# Patient Record
Sex: Female | Born: 1975 | Race: White | Hispanic: No | Marital: Married | Smoking: Former smoker
Health system: Southern US, Community
[De-identification: ages and names within clinical notes are randomized; demographics above are authoritative.]

## PROBLEM LIST (undated history)

## (undated) DIAGNOSIS — N61 Mastitis without abscess: Secondary | ICD-10-CM

## (undated) DIAGNOSIS — G47419 Narcolepsy without cataplexy: Secondary | ICD-10-CM

## (undated) DIAGNOSIS — N200 Calculus of kidney: Secondary | ICD-10-CM

## (undated) DIAGNOSIS — K8020 Calculus of gallbladder without cholecystitis without obstruction: Secondary | ICD-10-CM

## (undated) DIAGNOSIS — F329 Major depressive disorder, single episode, unspecified: Secondary | ICD-10-CM

## (undated) DIAGNOSIS — G4710 Hypersomnia, unspecified: Secondary | ICD-10-CM

## (undated) DIAGNOSIS — G47411 Narcolepsy with cataplexy: Principal | ICD-10-CM

## (undated) DIAGNOSIS — F32A Depression, unspecified: Secondary | ICD-10-CM

## (undated) DIAGNOSIS — F419 Anxiety disorder, unspecified: Secondary | ICD-10-CM

## (undated) HISTORY — DX: Calculus of gallbladder without cholecystitis without obstruction: K80.20

## (undated) HISTORY — PX: LITHOTRIPSY: SUR834

## (undated) HISTORY — DX: Calculus of kidney: N20.0

## (undated) HISTORY — DX: Narcolepsy with cataplexy: G47.411

## (undated) HISTORY — PX: OTHER SURGICAL HISTORY: SHX169

## (undated) HISTORY — DX: Major depressive disorder, single episode, unspecified: F32.9

## (undated) HISTORY — DX: Anxiety disorder, unspecified: F41.9

## (undated) HISTORY — DX: Narcolepsy without cataplexy: G47.419

## (undated) HISTORY — DX: Mastitis without abscess: N61.0

## (undated) HISTORY — DX: Hypersomnia, unspecified: G47.10

## (undated) HISTORY — DX: Depression, unspecified: F32.A

---

## 1997-05-30 ENCOUNTER — Emergency Department (HOSPITAL_COMMUNITY): Admission: EM | Admit: 1997-05-30 | Discharge: 1997-05-30 | Payer: Self-pay | Admitting: Emergency Medicine

## 1997-08-25 ENCOUNTER — Other Ambulatory Visit: Admission: RE | Admit: 1997-08-25 | Discharge: 1997-08-25 | Payer: Self-pay | Admitting: Family Medicine

## 1997-10-14 ENCOUNTER — Ambulatory Visit (HOSPITAL_COMMUNITY): Admission: RE | Admit: 1997-10-14 | Discharge: 1997-10-14 | Payer: Self-pay | Admitting: Obstetrics and Gynecology

## 1998-01-26 ENCOUNTER — Encounter: Payer: Self-pay | Admitting: Emergency Medicine

## 1998-01-26 ENCOUNTER — Emergency Department (HOSPITAL_COMMUNITY): Admission: EM | Admit: 1998-01-26 | Discharge: 1998-01-26 | Payer: Self-pay | Admitting: Emergency Medicine

## 1999-01-18 ENCOUNTER — Other Ambulatory Visit: Admission: RE | Admit: 1999-01-18 | Discharge: 1999-01-18 | Payer: Self-pay | Admitting: Obstetrics and Gynecology

## 2000-06-05 ENCOUNTER — Encounter: Payer: Self-pay | Admitting: Family Medicine

## 2000-06-05 ENCOUNTER — Encounter: Admission: RE | Admit: 2000-06-05 | Discharge: 2000-06-05 | Payer: Self-pay | Admitting: Family Medicine

## 2001-03-13 ENCOUNTER — Other Ambulatory Visit: Admission: RE | Admit: 2001-03-13 | Discharge: 2001-03-13 | Payer: Self-pay | Admitting: Obstetrics and Gynecology

## 2001-09-22 ENCOUNTER — Inpatient Hospital Stay (HOSPITAL_COMMUNITY): Admission: AD | Admit: 2001-09-22 | Discharge: 2001-09-22 | Payer: Self-pay | Admitting: Obstetrics and Gynecology

## 2001-09-23 ENCOUNTER — Inpatient Hospital Stay (HOSPITAL_COMMUNITY): Admission: AD | Admit: 2001-09-23 | Discharge: 2001-09-26 | Payer: Self-pay | Admitting: Obstetrics and Gynecology

## 2001-10-23 ENCOUNTER — Encounter: Admission: RE | Admit: 2001-10-23 | Discharge: 2001-11-22 | Payer: Self-pay | Admitting: Obstetrics and Gynecology

## 2001-10-29 ENCOUNTER — Other Ambulatory Visit: Admission: RE | Admit: 2001-10-29 | Discharge: 2001-10-29 | Payer: Self-pay | Admitting: Obstetrics and Gynecology

## 2001-11-23 ENCOUNTER — Encounter: Admission: RE | Admit: 2001-11-23 | Discharge: 2001-12-23 | Payer: Self-pay | Admitting: Obstetrics and Gynecology

## 2002-01-23 ENCOUNTER — Encounter: Admission: RE | Admit: 2002-01-23 | Discharge: 2002-02-22 | Payer: Self-pay | Admitting: Obstetrics and Gynecology

## 2002-02-23 ENCOUNTER — Encounter: Admission: RE | Admit: 2002-02-23 | Discharge: 2002-03-25 | Payer: Self-pay | Admitting: Obstetrics and Gynecology

## 2002-04-24 ENCOUNTER — Encounter: Admission: RE | Admit: 2002-04-24 | Discharge: 2002-05-24 | Payer: Self-pay | Admitting: Obstetrics and Gynecology

## 2002-11-25 ENCOUNTER — Other Ambulatory Visit: Admission: RE | Admit: 2002-11-25 | Discharge: 2002-11-25 | Payer: Self-pay | Admitting: Obstetrics and Gynecology

## 2005-06-27 ENCOUNTER — Ambulatory Visit (HOSPITAL_COMMUNITY): Admission: RE | Admit: 2005-06-27 | Discharge: 2005-06-27 | Payer: Self-pay | Admitting: Urology

## 2006-06-27 ENCOUNTER — Inpatient Hospital Stay (HOSPITAL_COMMUNITY): Admission: AD | Admit: 2006-06-27 | Discharge: 2006-06-29 | Payer: Self-pay | Admitting: Obstetrics and Gynecology

## 2006-06-30 ENCOUNTER — Encounter: Admission: RE | Admit: 2006-06-30 | Discharge: 2006-07-27 | Payer: Self-pay | Admitting: Obstetrics and Gynecology

## 2010-05-22 NOTE — H&P (Signed)
Joan Foley, TAT           ACCOUNT NO.:  [id number]   MEDICAL RECORD NO.:  [id number]          PATIENT TYPE:  INP   LOCATION:  9173                          FACILITY:  [facility]   PHYSICIAN:  Lenoard Aden, M.D.DATE OF BIRTH:  1975-04-20   DATE OF ADMISSION:  06/27/2006  DATE OF DISCHARGE:                              HISTORY & PHYSICAL   CHIEF COMPLAINT:  Moderate to severe polyhydramnios. She is a 35-year-  old white female, G2, P1, at 69 weeks' gestation with amniotic fluid  index of approximately 30 for induction. She is a nonsmoker, nondrinker.  She denies domestic or physical violence. She has a previous history of  spontaneous vaginal delivery, history of kidney stones, history of  laparoscopy for endometriosis, history of nonpremedicated heart murmur  which had previously resolved.   MEDICATIONS:  Included midodrine, prenatal vitamins and Zoloft as noted.   FAMILY HISTORY:  Of heart disease, colon and lung cancer, diabetes,  multiple sclerosis, rheumatoid arthritis, migraine headaches, epilepsy.   She had a history of a 7-pound 8-ounce spontaneous vaginal delivery in  2003.   On physical exam, she is a well-developed, well-nourished, white female  in no acute distress.  HEENT:  Normal.  LUNGS:  Clear.  HEART:  Regular rhythm.  ABDOMEN:  Soft, gravid, nontender, estimated fetal weight 7-1/2 pounds.  CERVIX:  3 cm, 80%, vertex and -1.  EXTREMITIES:  Revealed no cords.  NEUROLOGICAL EXAM:  Nonfocal.   IMPRESSION:  1. A 38-week intrauterine pregnancy.  2. Moderate to severe progressive polyhydramnios.   PLAN:  Proceed with induction.      Lenoard Aden, M.D.  Electronically Signed     RJT/MEDQ  D:  06/27/2006  T:  06/27/2006  Job:  [id number]   cc:   Lenoard Aden, M.D.  Fax: [phone]

## 2010-10-24 LAB — RAPID HIV SCREEN (WH-MAU): Rapid HIV Screen: NONREACTIVE

## 2010-10-24 LAB — CBC
HCT: 31.7 — ABNORMAL LOW
HCT: 34.5 — ABNORMAL LOW
Hemoglobin: 11.8 — ABNORMAL LOW
MCHC: 34.1
MCV: 91.1
Platelets: 141 — ABNORMAL LOW
Platelets: 146 — ABNORMAL LOW
RBC: 3.78 — ABNORMAL LOW
RDW: 13.4
RDW: 13.5
WBC: 10.6 — ABNORMAL HIGH
WBC: 13.5 — ABNORMAL HIGH

## 2010-10-24 LAB — RPR: RPR Ser Ql: NONREACTIVE

## 2012-08-20 ENCOUNTER — Encounter: Payer: Self-pay | Admitting: Neurology

## 2012-08-20 ENCOUNTER — Ambulatory Visit: Payer: 59 | Admitting: Neurology

## 2012-08-21 ENCOUNTER — Other Ambulatory Visit: Payer: Self-pay | Admitting: Neurology

## 2012-08-21 DIAGNOSIS — G47419 Narcolepsy without cataplexy: Secondary | ICD-10-CM

## 2012-08-21 MED ORDER — SODIUM OXYBATE 500 MG/ML PO SOLN: 4500.0000 mg | ORAL | Status: DC

## 2012-12-28 ENCOUNTER — Encounter: Payer: Self-pay | Admitting: Neurology

## 2012-12-28 ENCOUNTER — Ambulatory Visit (INDEPENDENT_AMBULATORY_CARE_PROVIDER_SITE_OTHER): Payer: 59 | Admitting: Neurology

## 2012-12-28 ENCOUNTER — Encounter (INDEPENDENT_AMBULATORY_CARE_PROVIDER_SITE_OTHER): Payer: Self-pay

## 2012-12-28 VITALS — BP 111/64 | HR 71 | Ht 64.0 in | Wt 128.0 lb

## 2012-12-28 DIAGNOSIS — G47411 Narcolepsy with cataplexy: Secondary | ICD-10-CM

## 2012-12-28 DIAGNOSIS — Z79899 Other long term (current) drug therapy: Secondary | ICD-10-CM | POA: Insufficient documentation

## 2012-12-28 DIAGNOSIS — G47419 Narcolepsy without cataplexy: Secondary | ICD-10-CM

## 2012-12-28 HISTORY — DX: Narcolepsy with cataplexy: G47.411

## 2012-12-28 MED ORDER — CLONAZEPAM 1 MG PO TABS: 1.0000 mg | ORAL_TABLET | Freq: Every day | ORAL | Status: DC

## 2012-12-28 MED ORDER — SODIUM OXYBATE 500 MG/ML PO SOLN: 4500.0000 mg | ORAL | Status: DC

## 2012-12-28 NOTE — Patient Instructions (Signed)
Narcolepsy Narcolepsy is a disabling neurological disorder of sleep regulation. It affects the control of sleep. It also affects the control of wakefulness. It is an interruption of the dreaming state of sleep. This state is known as REM or rapid eye movement sleep.  SYMPTOMS  The development, number, and severity of symptoms vary widely among people with the disorder. Symptoms generally begin between the ages of 15 and 30. The four classic symptoms of the disorder are:   Excessive daytime sleepiness.  Cataplexy. This is sudden, brief episodes of muscle weakness or paralysis. It is caused by strong emotions. Common strong emotions are laughter, anger, surprise, or anticipation.  Sleep paralysis. This is paralysis upon falling asleep or waking up.  Hallucinations. These are vivid dream-like images that occur at when you first fall asleep. Other symptoms include:   Unrelenting excessive sleepiness. This is usually the first and most obvious symptom.  Sleep attacks. Patients have strong sleep attacks throughout the day. These attacks can last for 30 seconds to more than 30 minutes. These happen no matter how much or how well the person slept the night before. These attacks end up making the person sleep at work and social events. The person can fall asleep while eating, talking, and driving. They also fall asleep at other out of place times.  Disturbed nighttime sleep.  Tossing and turning in bed.  Leg jerks.  Nightmares.  Waking up often. DIAGNOSIS  It's possible that genetics play a role in this disorder. Narcolepsy is not a rare disorder. It is often misdiagnosed. It is often diagnosed years after symptoms first appear. Early diagnosis and treatment are important. This help the physical and mental well-being of the patient. TREATMENT  There is no cure for narcolepsy. The symptoms can be controlled with behavioral and medical therapy. The excessive daytime sleepiness may be treated with  stimulant drugs. It may also be treated with the drug modafinil (Provigil). Cataplexy and other REM-sleep symptoms may be treated with antidepressant medications. Medications will reduce the symptoms. Medications will not ease symptoms entirely. Many available medications have side effects. Basic lifestyle changes may also reduce the symptoms. These changes include having regular sleep schedules and scheduled daytime naps. Other lifestyle changes include avoiding "over-stimulating" situations. Document Released: 12/14/2001 Document Revised: 03/18/2011 Document Reviewed: 12/24/2004 ExitCare Patient Information 2014 [employer].  

## 2012-12-28 NOTE — Progress Notes (Signed)
[facility]  Provider:  Melvyn Novas, M D  Referring Provider: / Primary Care Physician:  Jobe Igo    HPI:  Joan Foley is a 37 y.o. 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 Nolen Mu,  Joan Foley comes narcolepsy was diagnosed after a positive MSL T. on 06-27-09 following normal polysomnography. MSLT showed a MSL of 6. 1 minutes .   At the time she had endorsed  the Epworth sleepiness score at 18 points previously even 20 points at Dr. Loralie Champagne office. She had been temporarily on the residual but did not feel that it was enough to help her of his fatigue and sleepiness.  She has been treated with Zoloft and clonazepam for anxiety and depression in the past.  The patient originally presented with a body mass index of 29.5 at the time of her sleep evaluation.  Meanwhile on Xyrem she has lost weight.  Her last laboratory studies from 02-14-12 are all entirely normal.  She had normal sodium, potassium, chloride, carbon dioxide levels. Normal glucose and kidney function. Not anemic nor any elevated white blood cells. [name] states that she just changed employment and that she went from part-time to full-time but is actually physically and cognitively well able now to handle the challenges. She seems to feel more comfortable paternal job environment.  Today 's Epworth sleepiness score was and there is endorsed at 10 point and her fatigue severity score at 28 points. 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  Her office has a window now, unlike the old work place.   She recently had an unplanned stay over at a friends house and couldn't take her XYREM,  She had fragmented, very light sleep and felt poorly the following day.      Review of Systems: Out of a complete 14 system review, the patient complains of only the following symptoms, and all  other reviewed systems are negative. FSS 28 , Epworth 10,   History   Social History  . Marital Status: Married    Spouse Name: N/A    Number of Children: 2  . Years of Education: N/A   Occupational History  . Not on file.   Social History Main Topics  . Smoking status: Former Games developer  . Smokeless tobacco: Not on file     Comment: quit smoking in 2005  . Alcohol Use: Yes     Comment: socially  . Drug Use: Not on file  . Sexual Activity: Not on file   Other Topics Concern  . Not on file   Social History Narrative  . No narrative on file    Family History  Problem Relation Age of Onset  . Anxiety disorder Mother   . Depression Mother   . Narcolepsy Father   . Depression Father   . Anxiety disorder Father     Past Medical History  Diagnosis Date  . Excessive sleepiness   . 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 Anxiety   . Depression   . Kidney stone   . Mastitis     after breast feeding  . Gallstones   . 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 Lithotripsy      with renal stones  . Heart disease      valve  Current Outpatient Prescriptions  Medication Sig Dispense Refill  . clonazePAM (KLONOPIN) 1 MG tablet Take 1 mg by mouth daily.      Marland Kitchen lamoTRIgine (LAMICTAL) 100 MG tablet Take 100 mg by mouth daily.      . Sertraline HCl (ZOLOFT PO) Take 150 mg by mouth daily.      . Sodium Oxybate (XYREM) 500 MG/ML SOLN Take 9 mL (4,500 mg total) by mouth as directed. 4.5 gram twice nightly  270 mL  5   No current facility-administered medications for this visit.    Allergies as of 12/28/2012 - Review Complete 12/28/2012  Allergen Reaction Noted  . Seasonal ic [cholestatin]  08/20/2012    Vitals: There were no vitals taken for this visit. Last Weight:  Wt Readings from Last 1 Encounters:  08/20/12 118 lb (53.524 kg)   Last Height:   Ht Readings from Last 1 Encounters:  08/20/12 5' 3.5"  (1.613 m)    Physical exam:  General: The patient is awake, alert and appears not in acute distress. The patient is well groomed. Head: Normocephalic, atraumatic. Neck is supple. Mallampati 2 , neck circumference: 14 . Cardiovascular:  Regular rate and rhythm , without  murmurs or carotid bruit, and without distended neck veins. Respiratory: Lungs are clear to auscultation. Skin:  Without evidence of edema, or rash Trunk: BMI is normal,  posture.  Neurologic exam : The patient is awake and alert, oriented to place and time.  Memory subjective  described as intact. There is a normal attention span & concentration ability. Speech is fluent without  dysarthria, dysphonia or aphasia.  Mood and affect are appropriate. She stated she had a bout of anxiety before her recent job change, not any longer.   Cranial nerves: Pupils are equal and briskly reactive to light. Funduscopic exam without evidence of pallor or edema. Extraocular movements in vertical and horizontal planes intact and without nystagmus. Visual fields by finger perimetry are intact. Hearing to finger rub intact.   Facial sensation intact to fine touch. Facial motor strength is symmetric and tongue and uvula move midline.  Motor exam: Normal tone and  muscle bulk and symmetric strength in all extremities.  Sensory:  Fine touch, pinprick and vibration were tested in all extremities.  Proprioception is normal.  Coordination: Rapid alternating movements in the fingers/hands is tested and normal. Finger-to-nose maneuver tested and normal without evidence of ataxia, dysmetria or tremor.  Gait and station: Patient walks without assistive device .  Deep tendon reflexes: in the  upper and lower extremities are symmetric and intact. Babinski maneuver  down going.   Assessment:  After physical and neurologic examination, review of laboratory studies, imaging, neurophysiology testing and pre-existing records, assessment is :  1)  XYREM,  One nocturia at the time before the second dose.  2)  hypersomnia controlled, no sleep attack.   Plan:  Treatment plan and additional workup: XYREM _ continue at 9 grams divided into 2 doses at night.

## 2013-01-06 ENCOUNTER — Ambulatory Visit: Payer: 59 | Admitting: Neurology

## 2013-06-29 ENCOUNTER — Encounter (INDEPENDENT_AMBULATORY_CARE_PROVIDER_SITE_OTHER): Payer: Self-pay

## 2013-06-29 ENCOUNTER — Encounter: Payer: Self-pay | Admitting: Adult Health

## 2013-06-29 ENCOUNTER — Ambulatory Visit (INDEPENDENT_AMBULATORY_CARE_PROVIDER_SITE_OTHER): Payer: 59 | Admitting: Adult Health

## 2013-06-29 VITALS — BP 109/68 | HR 72 | Wt 123.5 lb

## 2013-06-29 DIAGNOSIS — Z79899 Other long term (current) drug therapy: Secondary | ICD-10-CM

## 2013-06-29 DIAGNOSIS — G47419 Narcolepsy without cataplexy: Secondary | ICD-10-CM

## 2013-06-29 MED ORDER — SODIUM OXYBATE 500 MG/ML PO SOLN: 4500.0000 mg | ORAL | Status: DC

## 2013-06-29 NOTE — Progress Notes (Signed)
PATIENT: Joan GeorgeMichelle M Foley DOB: 03/18/1975  REASON FOR VISIT: follow up HISTORY FROM: patient  HISTORY OF PRESENT ILLNESS:  Joan Foley is a 38 year old female with a history of narcolepsy without  cataplexy. She returns today for followup. The patient currently takes Xyrem and is tolerating it well. Her previous Epworth sleepiness score was 10 and fatigue severity score was 28. Today her at Epworth score is 9 and fatigue severity score is 36. The patient states she sleeps very well at night. States that her daytime sleepiness is controlled.  She is currently working a full-time job and is doing well. Patient reports that she has some family issues that is causing stress and has affected her sleep. She has recently reduced her dose of Klonopin and her anxiety has increased but she has noticed less sleepiness on the reduced dose.  No new medical issues since last seen.   REVIEW OF SYSTEMS: Full 14 system review of systems performed and notable only for:  Constitutional: N/A  Eyes: N/A Ear/Nose/Throat: N/A  Skin: N/A  Cardiovascular: N/A  Respiratory: N/A  Gastrointestinal: N/A  Genitourinary: N/A Hematology/Lymphatic: N/A  Endocrine: N/A Musculoskeletal:N/A  Allergy/Immunology: N/A  Neurological: N/A Psychiatric: N/A Sleep: N/A   ALLERGIES: Allergies  Allergen Reactions  . Seasonal Ic [Cholestatin]     HOME MEDICATIONS: Outpatient Prescriptions Prior to Visit  Medication Sig Dispense Refill  . clonazePAM (KLONOPIN) 1 MG tablet Take 1 tablet (1 mg total) by mouth daily. Take 1/2 tab po daily.  30 tablet  1  . lamoTRIgine (LAMICTAL) 100 MG tablet Take 100 mg by mouth daily.      . Sertraline HCl (ZOLOFT PO) Take 150 mg by mouth daily.      . Sodium Oxybate (XYREM) 500 MG/ML SOLN Take 9 mLs (4,500 mg total) by mouth as directed. 4.5 gram twice nightly  270 mL  5   No facility-administered medications prior to visit.    PAST MEDICAL HISTORY: Past Medical History    Diagnosis Date  . Excessive sleepiness   . 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 Anxiety   . Depression   . Kidney stone   . Mastitis     after breast feeding  . Gallstones   . 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 Lithotripsy      with renal stones  . Heart disease      valve    FAMILY HISTORY: Family History  Problem Relation Age of Onset  . Anxiety disorder Mother   . Depression Mother   . Narcolepsy Father   . Depression Father   . Anxiety disorder Father     SOCIAL HISTORY: History   Social History  . Marital Status: Married    Spouse Name: scott    Number of Children: 2  . 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 Smoking status: Former Games developermoker  . Smokeless tobacco: Not on file     Comment: quit smoking in 2005  . Alcohol Use: Yes     Comment: socially  . Drug Use: Not on file  . Sexual Activity: Not on file   Other Topics Concern  . Not on file   Social History Narrative  . No narrative on file      PHYSICAL EXAM  Filed Vitals:   06/29/13  1052  BP: 109/68  Pulse: 72  Weight: 123 lb 8 oz (56.019 kg)   Body mass index is 21.19 kg/(m^2).  Generalized: Well developed, in no acute distress   Neurological examination  Mentation: Alert oriented to time, place, history taking. Follows all commands speech and language fluent Cranial nerve II-XII: Pupils were equal round reactive to light.  Motor: The motor testing reveals 5 over 5 strength of all 4 extremities. Good symmetric motor tone is noted throughout.  Sensory: Sensory testing is intact to soft touch on all 4 extremities. No evidence of extinction is noted.  Coordination: Cerebellar testing reveals good finger-nose-finger and heel-to-shin bilaterally.  Gait and station: Gait is normal. Tandem gait is normal. Romberg is negative. No drift  is seen.  Reflexes: Deep tendon reflexes are symmetric and normal bilaterally.      DIAGNOSTIC DATA (LABS, IMAGING, TESTING) - I reviewed patient records, labs, notes, testing and imaging myself where available.  Lab Results  Component Value Date   WBC 13.5* 06/28/2006   HGB 11.0* 06/28/2006   HCT 31.7* 06/28/2006   MCV 90.1 06/28/2006   PLT 146* 06/28/2006     ASSESSMENT AND PLAN 38 y.o. 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 here with:  Patient has remained stable. Continues to take Xyrem with good benefit. I will refill today. Epworth Score is a 9.  Will check CMP today. Patient has reduced her Klonopin to 0.5mg , her anxiety has increased but she is learning other ways to control her anxiety. Follow-up in 6 months or sooner if needed.    Joan PennyMegan Millikan, MSN, NP-C 06/29/2013, 10:59 AM [facility] [address], [address] [place], [place] [address] [phone]  Note: This document was prepared with digital dictation and possible smart phrase technology. Any transcriptional errors that result from this process are unintentional.

## 2013-06-29 NOTE — Patient Instructions (Signed)
Narcolepsy Narcolepsy is a disabling neurological disorder of sleep regulation. It affects the control of sleep. It also affects the control of wakefulness. It is an interruption of the dreaming state of sleep. This state is known as REM or rapid eye movement sleep.  SYMPTOMS  The development, number, and severity of symptoms vary widely among people with the disorder. Symptoms generally begin between the ages of 15 and 30. The four classic symptoms of the disorder are:   Excessive daytime sleepiness.  Cataplexy. This is sudden, brief episodes of muscle weakness or paralysis. It is caused by strong emotions. Common strong emotions are laughter, anger, surprise, or anticipation.  Sleep paralysis. This is paralysis upon falling asleep or waking up.  Hallucinations. These are vivid dream-like images that occur at when you first fall asleep. Other symptoms include:   Unrelenting excessive sleepiness. This is usually the first and most obvious symptom.  Sleep attacks. Patients have strong sleep attacks throughout the day. These attacks can last for 30 seconds to more than 30 minutes. These happen no matter how much or how well the person slept the night before. These attacks end up making the person sleep at work and social events. The person can fall asleep while eating, talking, and driving. They also fall asleep at other out of place times.  Disturbed nighttime sleep.  Tossing and turning in bed.  Leg jerks.  Nightmares.  Waking up often. DIAGNOSIS  It's possible that genetics play a role in this disorder. Narcolepsy is not a rare disorder. It is often misdiagnosed. It is often diagnosed years after symptoms first appear. Early diagnosis and treatment are important. This help the physical and mental well-being of the patient. TREATMENT  There is no cure for narcolepsy. The symptoms can be controlled with behavioral and medical therapy. The excessive daytime sleepiness may be treated with  stimulant drugs. It may also be treated with the drug modafinil (Provigil). Cataplexy and other REM-sleep symptoms may be treated with antidepressant medications. Medications will reduce the symptoms. Medications will not ease symptoms entirely. Many available medications have side effects. Basic lifestyle changes may also reduce the symptoms. These changes include having regular sleep schedules and scheduled daytime naps. Other lifestyle changes include avoiding "over-stimulating" situations. Document Released: 12/14/2001 Document Revised: 03/18/2011 Document Reviewed: 12/16/2012 Exit[employer] Patient Information 2015 [employer]. This information is not intended to replace advice given to you by your health care Maddy Graham. Make sure you discuss any questions you have with your health care Gee Habig.  

## 2013-06-29 NOTE — Progress Notes (Signed)
I agree with the assessment and plan as directed by NP .The patient is known to me .   DOHMEIER,CARMEN, MD  

## 2013-06-30 LAB — COMPREHENSIVE METABOLIC PANEL
ALBUMIN: 4.5 g/dL (ref 3.5–5.5)
ALK PHOS: 56 IU/L (ref 39–117)
ALT: 10 IU/L (ref 0–32)
AST: 19 IU/L (ref 0–40)
Albumin/Globulin Ratio: 2 (ref 1.1–2.5)
BILIRUBIN TOTAL: 0.3 mg/dL (ref 0.0–1.2)
BUN / CREAT RATIO: 14 (ref 8–20)
BUN: 11 mg/dL (ref 6–20)
CHLORIDE: 102 mmol/L (ref 96–108)
CO2: 27 mmol/L (ref 18–29)
Calcium: 10.2 mg/dL (ref 8.7–10.2)
Creatinine, Ser: 0.8 mg/dL (ref 0.57–1.00)
GFR calc non Af Amer: 94 mL/min/{1.73_m2} (ref >59)
GFR, EST AFRICAN AMERICAN: 109 mL/min/{1.73_m2} (ref >59)
Globulin, Total: 2.3 g/dL (ref 1.5–4.5)
Glucose: 104 mg/dL — ABNORMAL HIGH (ref 65–99)
POTASSIUM: 3.9 mmol/L (ref 3.5–5.2)
Sodium: 136 mmol/L (ref 134–144)
Total Protein: 6.8 g/dL (ref 6.0–8.5)

## 2013-06-30 NOTE — Progress Notes (Signed)
Quick Note:  Shared normal labs with patient and she verbalized understanding ______ 

## 2014-01-04 ENCOUNTER — Encounter: Payer: Self-pay | Admitting: Adult Health

## 2014-01-04 ENCOUNTER — Ambulatory Visit (INDEPENDENT_AMBULATORY_CARE_PROVIDER_SITE_OTHER): Payer: 59 | Admitting: Adult Health

## 2014-01-04 VITALS — BP 101/63 | HR 73 | Ht 64.0 in | Wt 117.0 lb

## 2014-01-04 DIAGNOSIS — G47419 Narcolepsy without cataplexy: Secondary | ICD-10-CM

## 2014-01-04 DIAGNOSIS — Z79899 Other long term (current) drug therapy: Secondary | ICD-10-CM

## 2014-01-04 NOTE — Progress Notes (Signed)
PATIENT: Joan Foley DOB: 06/04/1975  REASON FOR VISIT: follow up HISTORY FROM: patient  HISTORY OF PRESENT ILLNESS: Ms. 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 She returns today for follow-up. The patient is currently taking Xyrem and tolerating it well. Her Epworth score today is 7 was previously 9 and  her fatigue severity score is 27  was previously 36. The patient states that she goes to bed around 10 PM and arises at 5:30 AM. She feels that her daytime sleepiness has been controlled with Xyrem. No new medical history since last seen. Continues to work full time. Patient reports that her anxiety has improved. She feels that she has adapted with how to cope with her anxiety.   HISTORY 06/29/13: Ms. 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 She returns today for followup. The patient currently takes Xyrem and is tolerating it well. Her previous Epworth sleepiness score was 10 and fatigue severity score was 28. Today her at Epworth score is 9 and fatigue severity score is 36. The patient states she sleeps very well at night. States that her daytime sleepiness is controlled. She is currently working a full-time job and is doing well. Patient reports that she has some family issues that is causing stress and has affected her sleep. She has recently reduced her dose of Klonopin and her anxiety has increased but she has noticed less sleepiness on the reduced dose. No new medical issues since last seen.   REVIEW OF SYSTEMS: Out of a complete 14 system review of symptoms, the patient complains only of the following symptoms, and all other reviewed systems are negative.  ALLERGIES: Allergies  Allergen Reactions  . Seasonal Ic [Cholestatin]     HOME MEDICATIONS: Outpatient Prescriptions Prior to Visit  Medication Sig Dispense Refill  . clonazePAM (KLONOPIN) 1 MG tablet Take 1 tablet (1 mg total) by mouth  daily. Take 1/2 tab po daily. 30 tablet 1  . lamoTRIgine (LAMICTAL) 100 MG tablet Take 100 mg by mouth daily.    . Sertraline HCl (ZOLOFT PO) Take 150 mg by mouth daily.    . Sodium Oxybate (XYREM) 500 MG/ML SOLN Take 9 mLs (4,500 mg total) by mouth as directed. 4.5 gram twice nightly 270 mL 5   No facility-administered medications prior to visit.    PAST MEDICAL HISTORY: Past Medical History  Diagnosis Date  . Excessive sleepiness   . 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 Anxiety   . Depression   . Kidney stone   . Mastitis     after breast feeding  . Gallstones   . 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 Lithotripsy      with renal stones  . Heart disease      valve    FAMILY HISTORY: Family History  Problem Relation Age of Onset  . Anxiety disorder Mother   . Depression Mother   . Narcolepsy Father   . Depression Father   . Anxiety disorder Father     SOCIAL HISTORY: History   Social History  . Marital Status: Married    Spouse Name: scott    Number of Children: 2  . 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 Smoking status: Former Games developermoker  .  Smokeless tobacco: Not on file     Comment: quit smoking in 2005  . Alcohol Use: Yes     Comment: socially  . Drug Use: Not on file  . Sexual Activity: Not on file   Other Topics Concern  . Not on file   Social History Narrative      PHYSICAL EXAM  Filed Vitals:   01/04/14 1321  BP: 101/63  Pulse: 73  Height: 5\' 4"  (1.626 m)  Weight: 117 lb (53.071 kg)   Body mass index is 20.07 kg/(m^2).  Generalized: Well developed, in no acute distress  Neck: Circumference 12 inches,   Mallampati 2+   Neurological examination  Mentation: Alert oriented to time, place, history taking. Follows all commands speech and language fluent Cranial nerve II-XII: Pupils were  equal round reactive to light. Extraocular movements were full, visual field were full on confrontational test. Facial sensation and strength were normal. Uvula tongue midline. Head turning and shoulder shrug  were normal and symmetric. Motor: The motor testing reveals 5 over 5 strength of all 4 extremities. Good symmetric motor tone is noted throughout.  Sensory: Sensory testing is intact to soft touch on all 4 extremities. No evidence of extinction is noted.  Coordination: Cerebellar testing reveals good finger-nose-finger and heel-to-shin bilaterally.  Gait and station: Gait is normal. Tandem gait is normal. Romberg is negative. No drift is seen.  Reflexes: Deep tendon reflexes are symmetric and normal bilaterally.    DIAGNOSTIC DATA (LABS, IMAGING, TESTING) - I reviewed patient records, labs, notes, testing and imaging myself where available.  Lab Results  Component Value Date   WBC 13.5* 06/28/2006   HGB 11.0* 06/28/2006   HCT 31.7* 06/28/2006   MCV 90.1 06/28/2006   PLT 146* 06/28/2006      Component Value Date/Time   NA 136 06/29/2013 1125   K 3.9 06/29/2013 1125   CL 102 06/29/2013 1125   CO2 27 06/29/2013 1125   GLUCOSE 104* 06/29/2013 1125   BUN 11 06/29/2013 1125   CREATININE 0.80 06/29/2013 1125   CALCIUM 10.2 06/29/2013 1125   PROT 6.8 06/29/2013 1125   AST 19 06/29/2013 1125   ALT 10 06/29/2013 1125   ALKPHOS 56 06/29/2013 1125   BILITOT 0.3 06/29/2013 1125   GFRNONAA 94 06/29/2013 1125   GFRAA 109 06/29/2013 1125      ASSESSMENT AND PLAN 38 y.o. 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 here with:  1. Narcolepsy  Overall the patient's daytime sleepiness is controlled with Xyrem. Her Epworth score and fatigue score has decreased since last visit.   I will check blood work today. If her symptoms worsen or she  develops new symptoms she should let us know. She will follow-up in 6 months.     Butch PennyMegan Isamar Wellbrock, MSN, NP-C 01/04/2014, 1:30 PM [facility] [address], [address] [place], [place] [address] [phone]  Note: This document was prepared with digital dictation and possible smart phrase technology. Any transcriptional errors that result from this process are unintentional.

## 2014-01-04 NOTE — Progress Notes (Signed)
I agree with the assessment and plan as directed by NP .The patient is known to me .   Ariyon Mittleman, MD  

## 2014-01-04 NOTE — Patient Instructions (Signed)
Continue Xyrem  I will check blood work today If your symptoms worsen or you develop new symptoms please let us know.   

## 2014-01-05 ENCOUNTER — Telehealth: Payer: Self-pay | Admitting: *Deleted

## 2014-01-05 LAB — COMPREHENSIVE METABOLIC PANEL
ALK PHOS: 54 IU/L (ref 39–117)
ALT: 11 IU/L (ref 0–32)
AST: 15 IU/L (ref 0–40)
Albumin/Globulin Ratio: 2.6 — ABNORMAL HIGH (ref 1.1–2.5)
Albumin: 4.9 g/dL (ref 3.5–5.5)
BILIRUBIN TOTAL: 0.4 mg/dL (ref 0.0–1.2)
BUN / CREAT RATIO: 18 (ref 8–20)
BUN: 14 mg/dL (ref 6–20)
CHLORIDE: 99 mmol/L (ref 97–108)
CO2: 22 mmol/L (ref 18–29)
Calcium: 9.5 mg/dL (ref 8.7–10.2)
Creatinine, Ser: 0.78 mg/dL (ref 0.57–1.00)
GFR calc non Af Amer: 97 mL/min/{1.73_m2} (ref >59)
GFR, EST AFRICAN AMERICAN: 112 mL/min/{1.73_m2} (ref >59)
Globulin, Total: 1.9 g/dL (ref 1.5–4.5)
Glucose: 82 mg/dL (ref 65–99)
POTASSIUM: 4.2 mmol/L (ref 3.5–5.2)
SODIUM: 137 mmol/L (ref 134–144)
Total Protein: 6.8 g/dL (ref 6.0–8.5)

## 2014-01-05 NOTE — Telephone Encounter (Signed)
Called patient and gave the results to her

## 2014-05-26 ENCOUNTER — Other Ambulatory Visit: Payer: Self-pay | Admitting: Neurology

## 2014-05-26 DIAGNOSIS — G47419 Narcolepsy without cataplexy: Secondary | ICD-10-CM

## 2014-05-26 MED ORDER — SODIUM OXYBATE 500 MG/ML PO SOLN: 4500.0000 mg | ORAL | Status: DC

## 2014-05-26 NOTE — Telephone Encounter (Signed)
Request entered, forwarded to provider for approval.  

## 2014-05-26 NOTE — Telephone Encounter (Signed)
Pt called stating pharmacy ([facility]) has faxed refill request forSodium Oxybate (XYREM) 500 MG/ML SOLN over twice and has not received a response. Pt called requesting a refill. Pt can be reached at [phone].

## 2014-05-26 NOTE — Telephone Encounter (Signed)
Rx signed and faxed.

## 2014-07-13 ENCOUNTER — Encounter: Payer: Self-pay | Admitting: Adult Health

## 2014-07-13 ENCOUNTER — Telehealth: Payer: Self-pay

## 2014-07-13 ENCOUNTER — Ambulatory Visit (INDEPENDENT_AMBULATORY_CARE_PROVIDER_SITE_OTHER): Payer: 59 | Admitting: Adult Health

## 2014-07-13 VITALS — BP 97/65 | HR 82 | Ht 64.0 in | Wt 124.0 lb

## 2014-07-13 DIAGNOSIS — G47419 Narcolepsy without cataplexy: Secondary | ICD-10-CM | POA: Diagnosis not present

## 2014-07-13 DIAGNOSIS — Z5181 Encounter for therapeutic drug level monitoring: Secondary | ICD-10-CM

## 2014-07-13 NOTE — Progress Notes (Signed)
PATIENT: Zachary GeorgeMichelle M Hipolito DOB: 08/09/1975  REASON FOR VISIT: follow up- Narcolepsy HISTORY FROM: patient  HISTORY OF PRESENT ILLNESS: 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 She returns today for follow-up. The patient states that she has been doing well. She has continued to take Xyrem and tolerating it well. She does state that about a month ago she thought she was going into a depressive state however she was able to overcome this. She states in the past she has had episodes like this before. The patient denies being depressed currently. Denies any suicidal thoughts. She continues to go to bed around 10 PM and arises at 5:30 PM. She denies any new medical issues. She returns today for an evaluation.  HISTORY 01/04/14: 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 She returns today for follow-up. The patient is currently taking Xyrem and tolerating it well. Her Epworth score today is 7 was previously 9 and her fatigue severity score is 27 was previously 36. The patient states that she goes to bed around 10 PM and arises at 5:30 AM. She feels that her daytime sleepiness has been controlled with Xyrem. No new medical history since last seen. Continues to work full time. Patient reports that her anxiety has improved. She feels that she has adapted with how to cope with her anxiety.   HISTORY 06/29/13: 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 She returns today for followup. The patient currently takes Xyrem and is tolerating it well. Her previous Epworth sleepiness score was 10 and fatigue severity score was 28. Today her at Epworth score is 9 and fatigue severity score is 36. The patient states she sleeps very well at night. States that her daytime sleepiness is controlled. She is currently working a full-time job and is doing well. Patient reports that she has some  family issues that is causing stress and has affected her sleep. She has recently reduced her dose of Klonopin and her anxiety has increased but she has noticed less sleepiness on the reduced dose. No new medical issues since last seen.    REVIEW OF SYSTEMS: Out of a complete 14 system review of symptoms, the patient complains only of the following symptoms, and all other reviewed systems are negative.  See history of present illness  ALLERGIES: Allergies  Allergen Reactions  . Seasonal Ic [Cholestatin]     HOME MEDICATIONS: Outpatient Prescriptions Prior to Visit  Medication Sig Dispense Refill  . clonazePAM (KLONOPIN) 1 MG tablet Take 1 tablet (1 mg total) by mouth daily. Take 1/2 tab po daily. 30 tablet 1  . lamoTRIgine (LAMICTAL) 100 MG tablet Take 100 mg by mouth daily.    . Sertraline HCl (ZOLOFT PO) Take 150 mg by mouth daily.    . Sodium Oxybate (XYREM) 500 MG/ML SOLN Take 9 mLs (4,500 mg total) by mouth as directed. 4.5 gram twice nightly 270 mL 5   No facility-administered medications prior to visit.    PAST MEDICAL HISTORY: Past Medical History  Diagnosis Date  . Excessive sleepiness   . 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 Anxiety   . Depression   . Kidney stone   . Mastitis     after breast feeding  . Gallstones   . 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 Lithotripsy      with renal stones  . Heart disease      valve    FAMILY HISTORY: Family History  Problem Relation Age of Onset  . Anxiety disorder Mother   . Depression Mother   . Narcolepsy Father   . Depression Father   . Anxiety disorder Father     PHYSICAL EXAM  Filed Vitals:   07/13/14 1326  BP: 97/65  Pulse: 82  Height:  (1.626 m)  Weight: 124 lb (56.246 kg)   Body mass index is 21.27 kg/(m^2).  Generalized: Well developed, in no acute distress   Neurological examination  Mentation:  Alert oriented to time, place, history taking. Follows all commands speech and language fluent Cranial nerve II-XII: Pupils were equal round reactive to light. Extraocular movements were full, visual field were full on confrontational test. Facial sensation and strength were normal. Uvula tongue midline. Head turning and shoulder shrug  were normal and symmetric. Motor: The motor testing reveals 5 over 5 strength of all 4 extremities. Good symmetric motor tone is noted throughout.  Sensory: Sensory testing is intact to soft touch on all 4 extremities. No evidence of extinction is noted.  Coordination: Cerebellar testing reveals good finger-nose-finger and heel-to-shin bilaterally.  Gait and station: Gait is normal. Tandem gait is normal. Romberg is negative. No drift is seen.  Reflexes: Deep tendon reflexes are symmetric and normal bilaterally.     DIAGNOSTIC DATA (LABS, IMAGING, TESTING) - I reviewed patient records, labs, notes, testing and imaging myself where available.    ASSESSMENT AND PLAN 38 y.o. 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 here with:  1. Narcolepsy  Overall the patient is doing well. She will continue on Xyrem. Patient advised that if she has any episodes of depression she should let us know. We will check blood work today. If her symptoms worsen or she develops new symptoms she should let us know. Follow-up in 6 months or sooner if needed.  Butch Penny, MSN, NP-C 07/13/2014, 1:44 PM [facility] [address], [address] [place], [place] [address] [phone]  Note: This document was prepared with digital dictation and possible smart phrase technology. Any transcriptional errors that result from this process are unintentional.

## 2014-07-13 NOTE — Telephone Encounter (Signed)
I contacted [facility], and they verified the patient was enrolled in the [facility]S program on 06/08/2014.

## 2014-07-13 NOTE — Progress Notes (Signed)
I have read the note, and I agree with the clinical assessment and plan.  Joan Foley   

## 2014-07-13 NOTE — Patient Instructions (Signed)
Continue Xyrem If depression occurs please let us know Will check blood work today.

## 2014-07-14 ENCOUNTER — Telehealth: Payer: Self-pay

## 2014-07-14 ENCOUNTER — Telehealth: Payer: Self-pay | Admitting: Adult Health

## 2014-07-14 LAB — COMPREHENSIVE METABOLIC PANEL
A/G RATIO: 2.4 (ref 1.1–2.5)
ALBUMIN: 4.7 g/dL (ref 3.5–5.5)
ALT: 9 IU/L (ref 0–32)
AST: 15 IU/L (ref 0–40)
Alkaline Phosphatase: 56 IU/L (ref 39–117)
BILIRUBIN TOTAL: 0.3 mg/dL (ref 0.0–1.2)
BUN/Creatinine Ratio: 16 (ref 8–20)
BUN: 14 mg/dL (ref 6–20)
CALCIUM: 10 mg/dL (ref 8.7–10.2)
CHLORIDE: 100 mmol/L (ref 97–108)
CO2: 25 mmol/L (ref 18–29)
Creatinine, Ser: 0.86 mg/dL (ref 0.57–1.00)
GFR, EST AFRICAN AMERICAN: 99 mL/min/{1.73_m2} (ref >59)
GFR, EST NON AFRICAN AMERICAN: 86 mL/min/{1.73_m2} (ref >59)
GLUCOSE: 96 mg/dL (ref 65–99)
Globulin, Total: 2 g/dL (ref 1.5–4.5)
POTASSIUM: 4.7 mmol/L (ref 3.5–5.2)
Sodium: 141 mmol/L (ref 134–144)
TOTAL PROTEIN: 6.7 g/dL (ref 6.0–8.5)

## 2014-07-14 NOTE — Telephone Encounter (Signed)
Called and relayed normal labs spoke to patient.

## 2014-07-14 NOTE — Telephone Encounter (Signed)
-----   Message from Butch PennyMegan Millikan, NP sent at 07/14/2014  7:41 AM EDT ----- Lab work ok. Please call patient.

## 2014-07-14 NOTE — Telephone Encounter (Signed)
Called patient and left her a voice mail stating she was enrolled in in [facility]S X[facility]rem . 06/08/14

## 2014-07-14 NOTE — Telephone Encounter (Signed)
Please call patient and let her know she was enrolled in the REMs program for xyrem on 6/1. Thanks.

## 2014-10-25 ENCOUNTER — Other Ambulatory Visit: Payer: Self-pay

## 2014-10-25 DIAGNOSIS — G47419 Narcolepsy without cataplexy: Secondary | ICD-10-CM

## 2014-10-25 MED ORDER — SODIUM OXYBATE 500 MG/ML PO SOLN: 4500.0000 mg | ORAL | Status: DC

## 2014-10-25 NOTE — Telephone Encounter (Signed)
Rx signed and faxed.

## 2015-01-19 ENCOUNTER — Ambulatory Visit: Payer: 59 | Admitting: Adult Health

## 2015-02-02 ENCOUNTER — Telehealth: Payer: Self-pay

## 2015-02-02 NOTE — Telephone Encounter (Signed)
PA for xyrem for pt's [facility] is asking if pt is being treated with a sedative-hypnotic, FI:[id number]. Pt verified that she is being treated with klonopin 0.5 mg by mouth in the morning for anxiety.  Will submit PA and wait for results.

## 2015-02-09 NOTE — Telephone Encounter (Signed)
Called [employer] to find out status of PA for xyrem. It is still under review. I was told that it may take up to 15 days to complete. Pt's ID is [id number].

## 2015-02-14 ENCOUNTER — Encounter: Payer: Self-pay | Admitting: Adult Health

## 2015-02-14 ENCOUNTER — Ambulatory Visit (INDEPENDENT_AMBULATORY_CARE_PROVIDER_SITE_OTHER): Payer: BLUE CROSS/BLUE SHIELD | Admitting: Adult Health

## 2015-02-14 VITALS — BP 107/57 | HR 69 | Ht 64.0 in | Wt 125.0 lb

## 2015-02-14 DIAGNOSIS — G47419 Narcolepsy without cataplexy: Secondary | ICD-10-CM | POA: Diagnosis not present

## 2015-02-14 NOTE — Progress Notes (Signed)
PATIENT: Joan Foley Pat DOB: [birth year]  REASON FOR VISIT: follow up- narcolepsy HISTORY FROM: patient  HISTORY OF PRESENT ILLNESS: Joan Foley is a 40 year old female with a history of narcolepsy. She returns today for follow-up. The patient has been taking Xyrem for over 5 years with good benefit. Unfortunately recently Xyrem has not been approved by her insurance company. She states that she has gone 2 weeks without this medication. She states that she helps her sleep throughout the day. She has been trying to take more frequent breaks at work in order to avoid falling asleep while working. She states that she is now napping a lot especially on the weekends. Even with the map she does not feel rested. She states that she has a continuous feeling of exhaustion. She states that her brain feels foggy. She feels that she has a hard time constructing her thoughts while at work. In the past the patient has tried Nuvigil without benefit. The patient was unable to use stimulants such as Adderall due to increased heart rate. Patient states that she has increased heart rate with medication such as Sudafed. The patient is also on Klonopin. She uses this medication for anxiety. Over the last year she has decreased her Klonopin from 1 mg to 0.5 mg. She states that her anxiety did increase with the decrease in Klonopin however she has been trying to tolerate it. She returns today for an evaluation.  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 She returns today for follow-up. The patient states that she has been doing well. She has continued to take Xyrem and tolerating it well. She does state that about a month ago she thought she was going into a depressive state however she was able to overcome this. She states in the past she has had episodes like this before. The patient denies being depressed currently. Denies any suicidal thoughts. She continues  to go to bed around 10 PM and arises at 5:30 PM. She denies any new medical issues. She returns today for an evaluation.  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 She returns today for follow-up. The patient is currently taking Xyrem and tolerating it well. Her Epworth score today is 7 was previously 9 and her fatigue severity score is 27 was previously 36. The patient states that she goes to bed around 10 PM and arises at 5:30 AM. She feels that her daytime sleepiness has been controlled with Xyrem. No new medical history since last seen. Continues to work full time. Patient reports that her anxiety has improved. She feels that she has adapted with how to cope with her anxiety.   REVIEW OF SYSTEMS: Out of a complete 14 system review of symptoms, the patient complains only of the following symptoms, and all other reviewed systems are negative.  See history of present illness  ALLERGIES: Allergies  Allergen Reactions  . Seasonal Ic [Cholestatin]     HOME MEDICATIONS: Outpatient Prescriptions Prior to Visit  Medication Sig Dispense Refill  . clonazePAM (KLONOPIN) 1 MG tablet Take 1 tablet (1 mg total) by mouth daily. Take 1/2 tab po daily. 30 tablet 1  . lamoTRIgine (LAMICTAL) 100 MG tablet Take 100 mg by mouth daily.    . Sertraline HCl (ZOLOFT PO) Take 150 mg by mouth daily.    . Sodium Oxybate (XYREM) 500 MG/ML SOLN Take 9 mLs (4,500 mg total)  by mouth as directed. 4.5 gram twice nightly (Patient not taking: Reported on 02/14/2015) 270 mL 5   No facility-administered medications prior to visit.    PAST MEDICAL HISTORY: Past Medical History  Diagnosis Date  . Excessive sleepiness   . 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 Anxiety   . Depression   . Kidney stone   . Mastitis     after breast feeding  . Gallstones   . 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 Lithotripsy      with renal stones  . Heart disease      valve    FAMILY HISTORY: Family History  Problem Relation Age of Onset  . Anxiety disorder Mother   . Depression Mother   . Narcolepsy Father   . Depression Father   . Anxiety disorder Father     SOCIAL HISTORY: Social History   Social History  . Marital Status: Married    Spouse Name: scott  . Number of Children: 2  . 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 Smoking status: Former Games developer  . Smokeless tobacco: Never Used     Comment: quit smoking in 2005  . Alcohol Use: 0.0 oz/week    0 Standard drinks or equivalent per week     Comment: socially  . Drug Use: No  . Sexual Activity: Not on file   Other Topics Concern  . Not on file   Social History Narrative   Patient lives at home with her husband [name].)   Patient works full time.   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   Caffeine none.      PHYSICAL EXAM  Filed Vitals:   02/14/15 1301  BP: 107/57  Pulse: 69  Height:  (1.626 m)  Weight: 125 lb (56.7 kg)   Body mass index is 21.45 kg/(m^2).  Generalized: Well developed, in no acute distress   Neurological examination  Mentation: Alert oriented to time, place, history taking. Follows all commands speech and language fluent Cranial nerve II-XII: Pupils were equal round reactive to light. Extraocular movements were full, visual field were full on confrontational test. Facial sensation and strength were normal. Uvula tongue midline. Head turning and shoulder shrug  were normal and symmetric. Motor: The motor testing reveals 5 over 5 strength of all 4 extremities. Good symmetric motor tone is noted throughout.  Sensory: Sensory testing is intact to soft touch on all 4 extremities. No evidence of extinction is noted.  Coordination: Cerebellar testing reveals good finger-nose-finger and heel-to-shin  bilaterally.  Gait and station: Gait is normal. Tandem gait is normal. Romberg is negative. No drift is seen.  Reflexes: Deep tendon reflexes are symmetric and normal bilaterally.   DIAGNOSTIC DATA (LABS, IMAGING, TESTING) - I reviewed patient records, labs, notes, testing and imaging myself where available.  Lab Results  Component Value Date   WBC 13.5* 06/28/2006   HGB 11.0* 06/28/2006   HCT 31.7* 06/28/2006   MCV 90.1 06/28/2006   PLT 146* 06/28/2006      Component Value Date/Time   NA 141 07/13/2014 1409   K 4.7 07/13/2014 1409   CL 100 07/13/2014 1409   CO2 25 07/13/2014 1409   GLUCOSE 96 07/13/2014 1409   BUN 14 07/13/2014 1409   CREATININE 0.86 07/13/2014 1409  CALCIUM 10.0 07/13/2014 1409   PROT 6.7 07/13/2014 1409   ALBUMIN 4.7 07/13/2014 1409   AST 15 07/13/2014 1409   ALT 9 07/13/2014 1409   ALKPHOS 56 07/13/2014 1409   BILITOT 0.3 07/13/2014 1409   BILITOT 0.4 01/04/2014 1355   GFRNONAA 86 07/13/2014 1409   GFRAA 99 07/13/2014 1409      ASSESSMENT AND PLAN 40 y.o. 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 here with:  1. Narcolepsy  The patient received the most benefit when she was using Xyrem. She is now having more sleepiness during the day. I have counseled the patient about her use of Klonopin in conjunction with Xyrem. The patient will continue to try to wean herself off the Klonopin but also keeping her anxiety under good control. I would like the patient to continue on Xyrem. The patient will follow-up in 3-4 months or sooner if needed.     Butch Penny, MSN, NP-C 02/14/2015, 1:12 PM [facility] [address], [address] [place], [place] [address] [phone]

## 2015-02-14 NOTE — Progress Notes (Signed)
I agree with the assessment and plan as directed by NP .The patient is known to me .   Maisa Bedingfield, MD  

## 2015-02-14 NOTE — Patient Instructions (Signed)
We will send in a appeals letter for xyrem If your symptoms worsen or you develop new symptoms please let us know.

## 2015-02-21 NOTE — Telephone Encounter (Signed)
The PA request was denied by [employer] and then the appeal was denied by [employer]. I will work on the external appeal through [facility] and also inform [facility] that the pa request was denied and the appeal was denied, so the pt may need to be screened for pt assistance.

## 2015-02-21 NOTE — Telephone Encounter (Signed)
Spoke to pt and advised her that the appeal for xyrem was also denied. The external appeal process has been started, but pt needs to fill out her portion. The insurance company should mail her the portion she needs to fill out. Pt verbalized understanding. I advised her that I have also alerted [facility]rem rems program that pt's insurance has denied coverage twice and she may be a candidate for pt assistance through them. Pt verbalized understanding.

## 2015-02-24 NOTE — Telephone Encounter (Signed)
Joan Foley with [employer] called inquiring about Xyrem PA. He was advised appeal has been denied. He is asking if a cc of denial could be faxed to [phone] attn: Joan Foley. He will try to get pt in PAP program.

## 2015-02-27 NOTE — Telephone Encounter (Signed)
Received a fax from the external appeals from pt's insurance stating that appeal has been denied as well. This is the third denial because pt's use of clonazepam was not discontinued before starting xyrem. Faxed all denials to Joan Foley at [employer].

## 2015-03-28 ENCOUNTER — Other Ambulatory Visit: Payer: Self-pay

## 2015-03-28 DIAGNOSIS — G47419 Narcolepsy without cataplexy: Secondary | ICD-10-CM

## 2015-03-28 MED ORDER — SODIUM OXYBATE 500 MG/ML PO SOLN: 4500.0000 mg | ORAL | Status: DC

## 2015-05-15 ENCOUNTER — Ambulatory Visit (INDEPENDENT_AMBULATORY_CARE_PROVIDER_SITE_OTHER): Payer: BLUE CROSS/BLUE SHIELD | Admitting: Adult Health

## 2015-05-15 ENCOUNTER — Encounter: Payer: Self-pay | Admitting: Adult Health

## 2015-05-15 VITALS — BP 102/59 | HR 69 | Ht 64.0 in | Wt 135.0 lb

## 2015-05-15 DIAGNOSIS — G47419 Narcolepsy without cataplexy: Secondary | ICD-10-CM | POA: Diagnosis not present

## 2015-05-15 DIAGNOSIS — Z5181 Encounter for therapeutic drug level monitoring: Secondary | ICD-10-CM

## 2015-05-15 NOTE — Progress Notes (Signed)
I agree with the assessment and plan as directed by NP .The patient is known to me .   Evaleen Sant, MD  

## 2015-05-15 NOTE — Patient Instructions (Signed)
Continue xyrem Blood work today If your symptoms worsen or you develop new symptoms please let us know.   

## 2015-05-15 NOTE — Progress Notes (Signed)
PATIENT: Joan Foley DOB: 23-Sep-1975  REASON FOR VISIT: follow up- narcolepsy HISTORY FROM: patient  HISTORY OF PRESENT ILLNESS: Joan Foley is a 40 year old female with a history of narcolepsy. She returns today for follow-up. The patient reports that she was able to get Xyrem through patient assistance. She states that she's been back on the medication for approximately one month. She states that things are returning to normal. Her Epworth sleepiness score is 7 and fatigue severity score is 31. She states that she takes her first dose before bedtime at 10 PM and wakes up at 1 AM to take her second dose. She continues to take Klonopin 0.5 mg daily for anxiety. Denies depression. She denies any new neurological symptoms. She returns today for an evaluation.  HISTORY 02/14/15 (MM): Joan Foley is a 40 year old female with a history of narcolepsy. She returns today for follow-up. The patient has been taking Xyrem for over 5 years with good benefit. Unfortunately recently Xyrem has not been approved by her insurance company. She states that she has gone 2 weeks without this medication. She states that she helps her sleep throughout the day. She has been trying to take more frequent breaks at work in order to avoid falling asleep while working. She states that she is now napping a lot especially on the weekends. Even with the map she does not feel rested. She states that she has a continuous feeling of exhaustion. She states that her brain feels foggy. She feels that she has a hard time constructing her thoughts while at work. In the past the patient has tried Nuvigil without benefit. The patient was unable to use stimulants such as Adderall due to increased heart rate. Patient states that she has increased heart rate with medication such as Sudafed. The patient is also on Klonopin. She uses this medication for anxiety. Over the last year she has decreased her Klonopin from 1 mg to 0.5 mg. She  states that her anxiety did increase with the decrease in Klonopin however she has been trying to tolerate it. She returns today for an evaluation.  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 She returns today for follow-up. The patient states that she has been doing well. She has continued to take Xyrem and tolerating it well. She does state that about a month ago she thought she was going into a depressive state however she was able to overcome this. She states in the past she has had episodes like this before. The patient denies being depressed currently. Denies any suicidal thoughts. She continues to go to bed around 10 PM and arises at 5:30 PM. She denies any new medical issues. She returns today for an evaluation.  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 She returns today for follow-up. The patient is currently taking Xyrem and tolerating it well. Her Epworth score today is 7 was previously 9 and her fatigue severity score is 27 was previously 36. The patient states that she goes to bed around 10 PM and arises at 5:30 AM. She feels that her daytime sleepiness has been controlled with Xyrem. No new medical history since last seen. Continues to work full time. Patient reports that her anxiety has improved. She feels that she has adapted with how to cope with her anxiety.   REVIEW OF SYSTEMS: Out of a complete 14 system review of symptoms, the  patient complains only of the following symptoms, and all other reviewed systems are negative.  See history of present illness  ALLERGIES: No Known Allergies  HOME MEDICATIONS: Outpatient Prescriptions Prior to Visit  Medication Sig Dispense Refill  . clonazePAM (KLONOPIN) 1 MG tablet Take 1 tablet (1 mg total) by mouth daily. Take 1/2 tab po daily. 30 tablet 1  . lamoTRIgine (LAMICTAL) 100 MG tablet Take 100 mg by mouth daily.    .  sertraline (ZOLOFT) 100 MG tablet Take 150 mg by mouth daily.   5  . Sodium Oxybate (XYREM) 500 MG/ML SOLN Take 9 mLs (4,500 mg total) by mouth as directed. 4.5 gram twice nightly 270 mL 5   No facility-administered medications prior to visit.    PAST MEDICAL HISTORY: Past Medical History  Diagnosis Date  . Excessive sleepiness   . 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 Anxiety   . Depression   . Kidney stone   . Mastitis     after breast feeding  . Gallstones   . 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 Lithotripsy      with renal stones  . Heart disease      valve    FAMILY HISTORY: Family History  Problem Relation Age of Onset  . Anxiety disorder Mother   . Depression Mother   . Narcolepsy Father   . Depression Father   . Anxiety disorder Father     SOCIAL HISTORY: Social History   Social History  . Marital Status: Married    Spouse Name: scott  . Number of Children: 2  . 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 Smoking status: Former Games developermoker  . Smokeless tobacco: Never Used     Comment: quit smoking in 2005  . Alcohol Use: 0.0 oz/week    0 Standard drinks or equivalent per week     Comment: socially  . Drug Use: No  . Sexual Activity: Not on file   Other Topics Concern  . Not on file   Social History Narrative   Patient lives at home with her husband [name](Scott.)   Patient works full time.   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   Caffeine none.      PHYSICAL EXAM  Filed Vitals:   05/15/15 1253  BP: 102/59  Pulse: 69  Height: 5\' 4"  (1.626 m)  Weight: 135 lb (61.236 kg)   Body mass index is 23.16 kg/(m^2).  Generalized: Well developed, in no acute distress   Neurological examination  Mentation: Alert oriented to time, place, history taking. Follows all commands speech and language  fluent Cranial nerve II-XII: Pupils were equal round reactive to light. Extraocular movements were full, visual field were full on confrontational test. Facial sensation and strength were normal. Uvula tongue midline. Head turning and shoulder shrug  were normal and symmetric. Motor: The motor testing reveals 5 over 5 strength of all 4 extremities. Good symmetric motor tone is noted throughout.  Sensory: Sensory testing is intact to soft touch on all 4 extremities. No evidence of extinction is noted.  Coordination: Cerebellar testing reveals good finger-nose-finger and heel-to-shin bilaterally.  Gait and station: Gait is normal.  Reflexes: Deep tendon reflexes are symmetric and normal bilaterally.   DIAGNOSTIC DATA (LABS, IMAGING, TESTING) - I reviewed patient records, labs, notes, testing  and imaging myself where available.      Component Value Date/Time   NA 141 07/13/2014 1409   K 4.7 07/13/2014 1409   CL 100 07/13/2014 1409   CO2 25 07/13/2014 1409   GLUCOSE 96 07/13/2014 1409   BUN 14 07/13/2014 1409   CREATININE 0.86 07/13/2014 1409   CALCIUM 10.0 07/13/2014 1409   PROT 6.7 07/13/2014 1409   ALBUMIN 4.7 07/13/2014 1409   AST 15 07/13/2014 1409   ALT 9 07/13/2014 1409   ALKPHOS 56 07/13/2014 1409   BILITOT 0.3 07/13/2014 1409   BILITOT 0.4 01/04/2014 1355   GFRNONAA 86 07/13/2014 1409   GFRAA 99 07/13/2014 1409      ASSESSMENT AND PLAN 40 y.o. 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 here with:  1. Narcolepsy  Overall the patient is doing well. She will continue on Xyrem. I will check blood work today. Patient advised that if her symptoms worsen or she develops any new symptoms she should let us know. She will follow-up in 6 months with Dr. Vergia Alcon, MSN, NP-C 05/15/2015, 1:20 PM [facility] [address], [address] [place], [place] [address] [phone]

## 2015-05-16 ENCOUNTER — Telehealth: Payer: Self-pay | Admitting: *Deleted

## 2015-05-16 LAB — COMPREHENSIVE METABOLIC PANEL
ALBUMIN: 4.8 g/dL (ref 3.5–5.5)
ALK PHOS: 57 IU/L (ref 39–117)
ALT: 10 IU/L (ref 0–32)
AST: 12 IU/L (ref 0–40)
Albumin/Globulin Ratio: 2 (ref 1.2–2.2)
BUN / CREAT RATIO: 20 (ref 9–23)
BUN: 15 mg/dL (ref 6–20)
Bilirubin Total: 0.3 mg/dL (ref 0.0–1.2)
CALCIUM: 10.1 mg/dL (ref 8.7–10.2)
CHLORIDE: 98 mmol/L (ref 96–106)
CO2: 25 mmol/L (ref 18–29)
CREATININE: 0.75 mg/dL (ref 0.57–1.00)
GFR, EST AFRICAN AMERICAN: 116 mL/min/{1.73_m2} (ref >59)
GFR, EST NON AFRICAN AMERICAN: 101 mL/min/{1.73_m2} (ref >59)
GLOBULIN, TOTAL: 2.4 g/dL (ref 1.5–4.5)
Glucose: 88 mg/dL (ref 65–99)
Potassium: 4 mmol/L (ref 3.5–5.2)
SODIUM: 140 mmol/L (ref 134–144)
TOTAL PROTEIN: 7.2 g/dL (ref 6.0–8.5)

## 2015-05-16 NOTE — Telephone Encounter (Signed)
LMVM for pt on mobile that her lab work normal.

## 2015-05-16 NOTE — Telephone Encounter (Signed)
-----   Message from Butch PennyMegan Millikan, NP sent at 05/16/2015  7:32 AM EDT ----- Lab work is normal. Please call patient.

## 2015-08-30 ENCOUNTER — Other Ambulatory Visit: Payer: Self-pay

## 2015-08-30 DIAGNOSIS — G47419 Narcolepsy without cataplexy: Secondary | ICD-10-CM

## 2015-08-30 MED ORDER — SODIUM OXYBATE 500 MG/ML PO SOLN: 4500.0000 mg | ORAL | 5 refills | Status: DC

## 2015-11-15 ENCOUNTER — Encounter: Payer: Self-pay | Admitting: Neurology

## 2015-11-15 ENCOUNTER — Ambulatory Visit (INDEPENDENT_AMBULATORY_CARE_PROVIDER_SITE_OTHER): Payer: BLUE CROSS/BLUE SHIELD | Admitting: Neurology

## 2015-11-15 VITALS — BP 120/78 | HR 72 | Resp 20 | Ht 64.0 in | Wt 124.0 lb

## 2015-11-15 DIAGNOSIS — G47411 Narcolepsy with cataplexy: Secondary | ICD-10-CM

## 2015-11-15 DIAGNOSIS — Z5181 Encounter for therapeutic drug level monitoring: Secondary | ICD-10-CM | POA: Diagnosis not present

## 2015-11-15 MED ORDER — SODIUM OXYBATE 500 MG/ML PO SOLN: 4500.0000 mg | ORAL | 5 refills | Status: DC

## 2015-11-15 NOTE — Patient Instructions (Signed)
As discussed, Xyrem has to be taken with very mindful caution: Taking Xyrem correctly is key. This means, take it only when you are fully ready to fall asleep, while in bed and refrain from doing any other activities, even brushing  your teeth after taking your first dose. The second dose will be about 2-1/2-4 hours after his first dose. You can go to the bathroom before your 2nd dose. Take your first dose, when actually IN BED, ready to sleep. No sitting up in bed, NO reading, NO using the cell phone or computer, NO getting up to use the bathroom. Take care of everything BEFORE sleep time. Try NOT to skip the second dose as the Xyrem is not going to stay in your system long enough with only one dose. Do not drink alcohol with Xyrem. If you do drink Alcohol, you cannot take your Xyrem doses that night.   

## 2015-11-15 NOTE — Progress Notes (Signed)
PATIENT: Joan Foley DOB: 05/03/1975  REASON FOR VISIT: follow up- narcolepsy HISTORY FROM: patient  HISTORY OF PRESENT ILLNESS:  11-15-2015 Joan Foley is a 40 year-old caucasian married female with a history of narcolepsy. She is on XYREM, now with patient assistance. After an insurance change she had been unable to obtain Xyrem for 3 or 4 months and felt miserable. Her excessive daytime sleepiness returned and after that. She had to be re-titrated to Xyrem which she now takes at a dose of 4.5 g twice nightly. The patient reports that she has not been EDS since [facility].  She states that she's been back on the medication for approximately 7 month. She states that things are returning to normal. Her Epworth sleepiness score is again  7 and fatigue severity score is 31. She states that she takes her first dose before bedtime at 10 PM and wakes up at 1 AM to take her second dose. She has a firm routine. She continues to take Klonopin 0.5 mg daily for.  Denies depression. She denies any new neurological symptoms. She does not experience any cataplexy. She has been on Xyrem since her now 968 year old son was 7318 month year old.  Her last lab results from 05/16/2015 were reviewed today, therefore all in normal limits including liver transaminases, renal clearance, electrolytes.  HISTORY 02/14/15 (MM): Joan Foley is a [age] year old female with a history of narcolepsy. She returns today for follow-up. The patient has been taking Xyrem for over 5 years with good benefit. Unfortunately recently Xyrem has not been approved by her insurance company. She states that she has gone 2 weeks without this medication. She states that she helps her sleep throughout the day. She has been trying to take more frequent breaks at work in order to avoid falling asleep while working. She states that she is now napping a lot especially on the weekends. Even with the map she does not feel rested. She states that  she has a continuous feeling of exhaustion. She states that her brain feels foggy. She feels that she has a hard time constructing her thoughts while at work. In the past the patient has tried Nuvigil without benefit. The patient was unable to use stimulants such as Adderall due to increased heart rate. Patient states that she has increased heart rate with medication such as Sudafed. The patient is also on Klonopin. She uses this medication for anxiety. Over the last year she has decreased her Klonopin from 1 mg to 0.5 mg. She states that her anxiety did increase with the decrease in Klonopin however she has been trying to tolerate it. She returns today for an evaluation.   REVIEW OF SYSTEMS: Out of a complete 14 system review of symptoms, the patient complains only of the following symptoms, and all other reviewed systems are negative. See history of present illness  ALLERGIES: No Known Allergies  HOME MEDICATIONS: Outpatient Medications Prior to Visit  Medication Sig Dispense Refill  . clonazePAM (KLONOPIN) 1 MG tablet Take 1 tablet (1 mg total) by mouth daily. Take 1/2 tab po daily. 30 tablet 1  . lamoTRIgine (LAMICTAL) 100 MG tablet Take 100 mg by mouth daily.    . sertraline (ZOLOFT) 100 MG tablet Take 150 mg by mouth daily.   5  . Sodium Oxybate (XYREM) 500 MG/ML SOLN Take 9 mLs (4,500 mg total) by mouth as directed. 4.5 gram twice nightly 270 mL 5   No facility-administered medications prior  to visit.     PAST MEDICAL HISTORY: Past Medical History:  Diagnosis Date  . Anxiety   . Depression   . Excessive sleepiness   . Gallstones   . Kidney stone   . Mastitis    after breast feeding  . 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 heart disease     valve  . LITHOTRIPSY     with renal stones    FAMILY HISTORY: Family History  Problem Relation Age of  Onset  . Anxiety disorder Mother   . Depression Mother   . Narcolepsy Father   . Depression Father   . Anxiety disorder Father     SOCIAL HISTORY: Social History   Social History  . Marital status: Married    Spouse name: scott  . Number of children: 2  . 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 Smoking status: Former Games developer  . Smokeless tobacco: Never Used     Comment: quit smoking in 2005  . Alcohol use 0.0 oz/week     Comment: socially  . Drug use: No  . Sexual activity: Not on file   Other Topics Concern  . Not on file   Social History Narrative   Patient lives at home with her husband [name].)   Patient works full time.   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   Caffeine none.      PHYSICAL EXAM  Vitals:   11/15/15 1333  BP: 120/78  Pulse: 72  Resp: 20  Weight: 124 lb (56.2 kg)  Height: 5\' 4"  (1.626 m)   Body mass index is 21.28 kg/m.  Generalized: Well developed, in no acute distress   Neurological examination  Mentation: Alert oriented to time, place, history taking. Follows all commands speech and language fluent. Cranial nerve : Taste and smell unchanged, Pupils were equal round reactive to light. Extraocular movements were full, visual field were full on confrontational test. Facial sensation and strength were normal. Uvula and tongue move midline. Head turning and shoulder shrug  were normal and symmetric. Motor:  5 / 5 strength of all 4 extremities with symmetric motor tone is noted throughout.  Sensory: Sensory testing to primary modalities is intact in 4 extremities. Coordination: intact  finger-nose  bilaterally.  Gait and station: Gait is normal.  Reflexes: symmetricbilaterally.   DIAGNOSTIC DATA (LABS, IMAGING, TESTING) - I reviewed patient records, labs, notes, testing and imaging myself where available.      Component Value Date/Time   NA 140 05/15/2015 1425   K 4.0  05/15/2015 1425   CL 98 05/15/2015 1425   CO2 25 05/15/2015 1425   GLUCOSE 88 05/15/2015 1425   BUN 15 05/15/2015 1425   CREATININE 0.75 05/15/2015 1425   CALCIUM 10.1 05/15/2015 1425   PROT 7.2 05/15/2015 1425   ALBUMIN 4.8 05/15/2015 1425   AST 12 05/15/2015 1425   ALT 10 05/15/2015 1425   ALKPHOS 57 05/15/2015 1425   BILITOT 0.3 05/15/2015 1425   GFRNONAA 101 05/15/2015 1425   GFRAA 116 05/15/2015 1425      ASSESSMENT AND PLAN 40 y.o. 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 here with:  1. Narcolepsy on Xyrem  is very well controlled, no cataplectic attacks reported.  The patient liver function tests have been repeatedly checked every 6 months and have always been normal. She continues to work gainfully employed full-time, she and her husband raise 2 children now 199 and 40 years old. Nocturnal childcare has never been a problem, spouse is very supportive and Mrs. Bonnett fund was able to take Xyrem even while her youngest child was 7818 month old. The anxiety disorder treatment with Klonopin has not interfered with her Xyrem therapy .   She will continue on Xyrem. I will check blood work today.  Patient advised that if her symptoms worsen or she develops any new symptoms she should let us know.  She will follow-up in 6 months with NP alternating with me.     Lavonne Cass, MD  11/15/2015, 1:57 PM [facility] [address], [address] [place], [place] [address] [phone]

## 2015-11-16 LAB — COMPREHENSIVE METABOLIC PANEL
ALBUMIN: 4.9 g/dL (ref 3.5–5.5)
ALK PHOS: 46 IU/L (ref 39–117)
ALT: 10 IU/L (ref 0–32)
AST: 15 IU/L (ref 0–40)
Albumin/Globulin Ratio: 2.2 (ref 1.2–2.2)
BILIRUBIN TOTAL: 0.6 mg/dL (ref 0.0–1.2)
BUN / CREAT RATIO: 17 (ref 9–23)
BUN: 14 mg/dL (ref 6–24)
CHLORIDE: 102 mmol/L (ref 96–106)
CO2: 24 mmol/L (ref 18–29)
Calcium: 10.1 mg/dL (ref 8.7–10.2)
Creatinine, Ser: 0.82 mg/dL (ref 0.57–1.00)
GFR calc Af Amer: 104 mL/min/{1.73_m2} (ref >59)
GFR calc non Af Amer: 90 mL/min/{1.73_m2} (ref >59)
GLUCOSE: 86 mg/dL (ref 65–99)
Globulin, Total: 2.2 g/dL (ref 1.5–4.5)
Potassium: 5.1 mmol/L (ref 3.5–5.2)
Sodium: 141 mmol/L (ref 134–144)
Total Protein: 7.1 g/dL (ref 6.0–8.5)

## 2015-11-20 ENCOUNTER — Telehealth: Payer: Self-pay

## 2015-11-20 NOTE — Telephone Encounter (Signed)
-----   Message from Melvyn Novasarmen Dohmeier, MD sent at 11/17/2015  4:38 PM EST ----- Normal [facility]. CD

## 2015-11-20 NOTE — Telephone Encounter (Signed)
I spoke to pt and advised her that her blood work was normal per Dr. Vickey Hugerohmeier. Pt verbalized understanding of results. Pt had no questions at this time but was encouraged to call back if questions arise.

## 2015-12-18 ENCOUNTER — Telehealth: Payer: Self-pay

## 2015-12-18 NOTE — Telephone Encounter (Signed)
PA sent through Cover My Meds today. Key: [id number]

## 2015-12-19 NOTE — Telephone Encounter (Signed)
Additional information requested sent in today.

## 2015-12-28 NOTE — Telephone Encounter (Signed)
Pt called to advise [employer] has been calling her says they can't get thru on the phones. She has been instructed to have RN call them reg PA at [phone] opt 2, 5. Pt was advised clinic is having phone issues and apologized.

## 2015-12-28 NOTE — Telephone Encounter (Signed)
I spoke to [facility] and they are aware that this has been denied for patient and we are working on appeals process.

## 2016-01-05 NOTE — Telephone Encounter (Signed)
[place]/[employer] [phone] called reg status of PA. He was advised it is in appeals. He said he would call back in 7-8 days to check status again  [facility]

## 2016-01-10 NOTE — Telephone Encounter (Signed)
The below number is to [employer]. I have faxed and mailed appeal out today.

## 2016-02-06 NOTE — Telephone Encounter (Signed)
[place]/[employer] [facility] [phone]   (f) [phone] called said the appeal for xyrem was denied on 1/20 and they are needing it to be faxed back. She is wanting to know if it was rec'd. Please call

## 2016-02-06 NOTE — Telephone Encounter (Signed)
I have not received a denial for the appeal. [facility] -[phone]. I spent 20 minutes on the phone. They report to me that the appeal was denied but refuse to send me the denial letter, saying it was sent to the pt, and the pt is responsible for providing us this letter. Pt should call member services to get a copy of this letter.  I called x[employer]rem. They need a copy of this letter to start the pt on patient assistance.  I called pt and explained this to her. [facility] and try and get this letter because she has not received it. She will call me back.

## 2016-02-07 NOTE — Telephone Encounter (Signed)
Pt called back, she has the denial letter and will fax it to me.

## 2016-02-07 NOTE — Telephone Encounter (Signed)
Received denial letter for appeal pa. Faxed to [employer]. Received a receipt of confirmation.

## 2016-04-17 ENCOUNTER — Telehealth: Payer: Self-pay

## 2016-04-17 DIAGNOSIS — G47411 Narcolepsy with cataplexy: Secondary | ICD-10-CM

## 2016-04-17 MED ORDER — SODIUM OXYBATE 500 MG/ML PO SOLN: 4500.0000 mg | ORAL | 5 refills | Status: DC

## 2016-04-17 NOTE — Telephone Encounter (Signed)
Rx printed, waiting for signature 

## 2016-05-14 ENCOUNTER — Encounter: Payer: Self-pay | Admitting: Adult Health

## 2016-05-14 ENCOUNTER — Ambulatory Visit (INDEPENDENT_AMBULATORY_CARE_PROVIDER_SITE_OTHER): Payer: BLUE CROSS/BLUE SHIELD | Admitting: Adult Health

## 2016-05-14 ENCOUNTER — Encounter (INDEPENDENT_AMBULATORY_CARE_PROVIDER_SITE_OTHER): Payer: Self-pay

## 2016-05-14 VITALS — BP 104/65 | HR 66 | Ht 64.0 in | Wt 127.5 lb

## 2016-05-14 DIAGNOSIS — Z5181 Encounter for therapeutic drug level monitoring: Secondary | ICD-10-CM | POA: Diagnosis not present

## 2016-05-14 DIAGNOSIS — G47419 Narcolepsy without cataplexy: Secondary | ICD-10-CM | POA: Diagnosis not present

## 2016-05-14 NOTE — Progress Notes (Signed)
PATIENT: Joan Foley DOB: 03-09-1975  REASON FOR VISIT: follow up- narcolepsy HISTORY FROM: patient  HISTORY OF PRESENT ILLNESS: Joan Foley is a 41 year old female with a history of narcolepsy. She returns today for follow-up. She continues to take Xyrem and is tolerating it well. She denies any significant daytime sleepiness. She is able to work and drive without falling asleep. She denies depression or suicidal thoughts. She is able to complete all ADLs independently. Her fatigue severity score is 23 an Epworth sleepiness score is 7. She continues to take Klonopin at bedtime. She returns today for an evaluation.  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 She returns today for follow-up. The patient reports that she was able to get Xyrem through patient assistance. She states that she's been back on the medication for approximately one month. She states that things are returning to normal. Her Epworth sleepiness score is 7 and fatigue severity score is 31. She states that she takes her first dose before bedtime at 10 PM and wakes up at 1 AM to take her second dose. She continues to take Klonopin 0.5 mg daily for anxiety. Denies depression. She denies any new neurological symptoms. She returns today for an evaluation.  HISTORY 02/14/15 (MM): Joan Foley is a 41 year old female with a history of narcolepsy. She returns today for follow-up. The patient has been taking Xyrem for over 5 years with good benefit. Unfortunately recently Xyrem has not been approved by her insurance company. She states that she has gone 2 weeks without this medication. She states that she helps her sleep throughout the day. She has been trying to take more frequent breaks at work in order to avoid falling asleep while working. She states that she is now napping a lot especially on the weekends. Even with the map she does not feel rested. She states that she has a continuous  feeling of exhaustion. She states that her brain feels foggy. She feels that she has a hard time constructing her thoughts while at work. In the past the patient has tried Nuvigil without benefit. The patient was unable to use stimulants such as Adderall due to increased heart rate. Patient states that she has increased heart rate with medication such as Sudafed. The patient is also on Klonopin. She uses this medication for anxiety. Over the last year she has decreased her Klonopin from 1 mg to 0.5 mg. She states that her anxiety did increase with the decrease in Klonopin however she has been trying to tolerate it. She returns today for an evaluation.  REVIEW OF SYSTEMS: Out of a complete 14 system review of symptoms, the patient complains only of the following symptoms, and all other reviewed systems are negative.  See history of present illness  ALLERGIES: No Known Allergies  HOME MEDICATIONS: Outpatient Medications Prior to Visit  Medication Sig Dispense Refill  . clonazePAM (KLONOPIN) 1 MG tablet Take 1 tablet (1 mg total) by mouth daily. Take 1/2 tab po daily. 30 tablet 1  . lamoTRIgine (LAMICTAL) 100 MG tablet Take 100 mg by mouth daily.    . sertraline (ZOLOFT) 100 MG tablet Take 150 mg by mouth daily.   5  . Sodium Oxybate (XYREM) 500 MG/ML SOLN Take 9 mLs (4,500 mg total) by mouth as directed. 4.5 gram twice nightly 270 mL 5   No facility-administered medications prior to visit.     PAST MEDICAL HISTORY: Past Medical History:  Diagnosis Date  .  Anxiety   . Depression   . Excessive sleepiness   . Gallstones   . Kidney stone   . Mastitis    after breast feeding  . 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 heart disease     valve  . LITHOTRIPSY     with renal stones    FAMILY HISTORY: Family History  Problem Relation Age of Onset  . Anxiety  disorder Mother   . Depression Mother   . Narcolepsy Father   . Depression Father   . Anxiety disorder Father     SOCIAL HISTORY: Social History   Social History  . Marital status: Married    Spouse name: scott  . Number of children: 2  . 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 Smoking status: Former Games developer  . Smokeless tobacco: Never Used     Comment: quit smoking in 2005  . Alcohol use 0.0 oz/week     Comment: socially  . Drug use: No  . Sexual activity: Not on file   Other Topics Concern  . Not on file   Social History Narrative   Patient lives at home with her husband [name].)   Patient works full time.   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   Caffeine none.      PHYSICAL EXAM  Vitals:   05/14/16 1256  BP: 104/65  Pulse: 66  Weight: 127 lb 7.2 oz (57.8 kg)  Height: 5\' 4"  (1.626 m)   Body mass index is 21.88 kg/m.  Generalized: Well developed, in no acute distress   Neurological examination  Mentation: Alert oriented to time, place, history taking. Follows all commands speech and language fluent Cranial nerve II-XII: Pupils were equal round reactive to light. Extraocular movements were full, visual field were full on confrontational test. Facial sensation and strength were normal. Uvula tongue midline. Head turning and shoulder shrug  were normal and symmetric. Motor: The motor testing reveals 5 over 5 strength of all 4 extremities. Good symmetric motor tone is noted throughout.  Sensory: Sensory testing is intact to soft touch on all 4 extremities. No evidence of extinction is noted.  Coordination: Cerebellar testing reveals good finger-nose-finger and heel-to-shin bilaterally.  Gait and station: Gait is normal. Tandem gait is normal. Romberg is negative. No drift is seen.  Reflexes: Deep tendon reflexes are symmetric and normal bilaterally.   DIAGNOSTIC DATA (LABS, IMAGING,  TESTING) - I reviewed patient records, labs, notes, testing and imaging myself where available.  Lab Results  Component Value Date   WBC 13.5 (H) 06/28/2006   HGB 11.0 (L) 06/28/2006   HCT 31.7 (L) 06/28/2006   MCV 90.1 06/28/2006   PLT 146 (L) 06/28/2006      Component Value Date/Time   NA 141 11/15/2015 1535   K 5.1 11/15/2015 1535   CL 102 11/15/2015 1535   CO2 24 11/15/2015 1535   GLUCOSE 86 11/15/2015 1535   BUN 14 11/15/2015 1535   CREATININE 0.82 11/15/2015 1535   CALCIUM 10.1 11/15/2015 1535   PROT 7.1 11/15/2015 1535   ALBUMIN 4.9 11/15/2015 1535   AST 15 11/15/2015 1535   ALT 10 11/15/2015 1535   ALKPHOS 46 11/15/2015 1535   BILITOT 0.6 11/15/2015 1535   GFRNONAA 90 11/15/2015 1535   GFRAA  104 11/15/2015 1535      ASSESSMENT AND PLAN 41 y.o. 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 here with:  1. Narcolepsy  Overall the patient is doing well. She will continue on Xyrem. I will check blood work today. She is advised that if she begins to experience depression or suicidal thoughts she should let us know. She will follow-up in 6 months or sooner if needed.  I spent 15 minutes with the patient 50% of this time was spent counseling the patient on her medication.   Butch PennyMegan Tahirah Sara, MSN, NP-C 05/14/2016, 1:10 PM [facility] [address], [address] [place], [place] [address] [phone]

## 2016-05-14 NOTE — Progress Notes (Signed)
I agree with the assessment and plan as directed by NP .The patient is known to me .   Colson Barco, MD  

## 2016-05-14 NOTE — Patient Instructions (Signed)
Continue Xyrem Blood work today If your symptoms worsen or you develop new symptoms please let us know.

## 2016-05-15 ENCOUNTER — Telehealth: Payer: Self-pay | Admitting: *Deleted

## 2016-05-15 LAB — COMPREHENSIVE METABOLIC PANEL
A/G RATIO: 2.1 (ref 1.2–2.2)
ALK PHOS: 47 IU/L (ref 39–117)
ALT: 8 IU/L (ref 0–32)
AST: 14 IU/L (ref 0–40)
Albumin: 4.6 g/dL (ref 3.5–5.5)
BILIRUBIN TOTAL: 0.5 mg/dL (ref 0.0–1.2)
BUN/Creatinine Ratio: 14 (ref 9–23)
BUN: 11 mg/dL (ref 6–24)
CHLORIDE: 99 mmol/L (ref 96–106)
CO2: 20 mmol/L (ref 18–29)
Calcium: 9.5 mg/dL (ref 8.7–10.2)
Creatinine, Ser: 0.77 mg/dL (ref 0.57–1.00)
GFR calc non Af Amer: 97 mL/min/{1.73_m2} (ref >59)
GFR, EST AFRICAN AMERICAN: 112 mL/min/{1.73_m2} (ref >59)
Globulin, Total: 2.2 g/dL (ref 1.5–4.5)
Glucose: 100 mg/dL — ABNORMAL HIGH (ref 65–99)
POTASSIUM: 4.5 mmol/L (ref 3.5–5.2)
Sodium: 140 mmol/L (ref 134–144)
Total Protein: 6.8 g/dL (ref 6.0–8.5)

## 2016-05-15 NOTE — Telephone Encounter (Signed)
Per Dolores HooseM Millikan, NP, LVM informing patient her lab results are unremarkable, left number for any questions.

## 2016-09-25 ENCOUNTER — Other Ambulatory Visit: Payer: Self-pay | Admitting: Neurology

## 2016-09-25 DIAGNOSIS — G47411 Narcolepsy with cataplexy: Secondary | ICD-10-CM

## 2016-09-25 MED ORDER — SODIUM OXYBATE 500 MG/ML PO SOLN: 4500.0000 mg | ORAL | 5 refills | Status: DC

## 2016-11-18 ENCOUNTER — Ambulatory Visit: Payer: BLUE CROSS/BLUE SHIELD | Admitting: Adult Health

## 2016-11-18 ENCOUNTER — Encounter: Payer: Self-pay | Admitting: Adult Health

## 2016-11-18 VITALS — BP 100/41 | HR 65 | Wt 133.0 lb

## 2016-11-18 DIAGNOSIS — Z5181 Encounter for therapeutic drug level monitoring: Secondary | ICD-10-CM | POA: Diagnosis not present

## 2016-11-18 DIAGNOSIS — G47419 Narcolepsy without cataplexy: Secondary | ICD-10-CM | POA: Diagnosis not present

## 2016-11-18 NOTE — Patient Instructions (Signed)
Your Plan:  Continue xyrem Blood work today If your symptoms worsen or you develop new symptoms please let us know.   Thank you for coming to see us at [facility]. I hope we have been able to provide you high quality care today.  You may receive a patient satisfaction survey over the next few weeks. We would appreciate your feedback and comments so that we may continue to improve ourselves and the health of our patients.  

## 2016-11-18 NOTE — Progress Notes (Signed)
PATIENT: Joan Foley DOB: 09/08/1975  REASON FOR VISIT: follow up-narcolepsy HISTORY FROM: patient  HISTORY OF PRESENT ILLNESS: Today 11/18/16  Joan Foley is a 41 year old female with a history of narcolepsy.  She returns today for follow-up.  She remains on Xyrem.  She states that she is tolerating this well.  She states that she has not been sleeping as well at night due to increased stress at her job.  She states that she is in the process of looking for new job.  She states that she is not falling asleep during the day.  She does state that there sometimes she has the desire to go to sleep but is not falling asleep.  She denies any significant depression.  She returns today for an evaluation.  HISTORY 05/14/16: Joan Foley is a 41 year old female with a history of narcolepsy. She returns today for follow-up. She continues to take Xyrem and is tolerating it well. She denies any significant daytime sleepiness. She is able to work and drive without falling asleep. She denies depression or suicidal thoughts. She is able to complete all ADLs independently. Her fatigue severity score is 23 an Epworth sleepiness score is 7. She continues to take Klonopin at bedtime. She returns today for an evaluation.  REVIEW OF SYSTEMS: Out of a complete 14 system review of symptoms, the patient complains only of the following symptoms, and all other reviewed systems are negative.  Epworth sleepiness score is 10, fatigue severity score 32  ALLERGIES: No Known Allergies  HOME MEDICATIONS: Outpatient Medications Prior to Visit  Medication Sig Dispense Refill  . clonazePAM (KLONOPIN) 1 MG tablet Take 1 tablet (1 mg total) by mouth daily. Take 1/2 tab po daily. 30 tablet 1  . lamoTRIgine (LAMICTAL) 100 MG tablet Take 100 mg by mouth daily.    . sertraline (ZOLOFT) 100 MG tablet Take 150 mg by mouth daily.   5  . Sodium Oxybate (XYREM) 500 MG/ML SOLN Take 9 mLs (4,500 mg total) by mouth as directed.  4.5 gram twice nightly 270 mL 5   No facility-administered medications prior to visit.     PAST MEDICAL HISTORY: Past Medical History:  Diagnosis Date  . Anxiety   . Depression   . Excessive sleepiness   . Gallstones   . Kidney stone   . Mastitis    after breast feeding  . 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 heart disease     valve  . LITHOTRIPSY     with renal stones    FAMILY HISTORY: Family History  Problem Relation Age of Onset  . Anxiety disorder Mother   . Depression Mother   . Narcolepsy Father   . Depression Father   . Anxiety disorder Father     SOCIAL HISTORY: Social History   Socioeconomic History  . Marital status: Married    Spouse name: scott  . Number of children: 2  . Years of education: college  . Highest education level: Not on file  Social Needs  . Financial resource strain: Not on file  . Food insecurity - worry: Not on file  . Food insecurity - inability: Not on file  . Transportation needs - medical: Not on file  . Transportation needs - non-medical: Not on file  Occupational History  . 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 Smoking status:  Former Smoker  . Smokeless tobacco: Never Used  . Tobacco comment: quit smoking in 2005  Substance and Sexual Activity  . Alcohol use: Yes    Alcohol/week: 0.0 oz    Comment: socially  . Drug use: No  . Sexual activity: Not on file  Other Topics Concern  . Not on file  Social History Narrative   Patient lives at home with her husband [name](Scott.)   Patient works full time.   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   Caffeine none.      PHYSICAL EXAM  Vitals:   11/18/16 1257  BP: (!) 100/41  Pulse: 65  Weight: 133 lb (60.3 kg)   Body mass index is 22.83 kg/m.  Generalized: Well developed, in no acute distress   Neurological examination  Mentation:  Alert oriented to time, place, history taking. Follows all commands speech and language fluent Cranial nerve II-XII: Pupils were equal round reactive to light. Extraocular movements were full, visual field were full on confrontational test. Facial sensation and strength were normal. Uvula tongue midline. Head turning and shoulder shrug  were normal and symmetric. Motor: The motor testing reveals 5 over 5 strength of all 4 extremities. Good symmetric motor tone is noted throughout.  Sensory: Sensory testing is intact to soft touch on all 4 extremities. No evidence of extinction is noted.  Coordination: Cerebellar testing reveals good finger-nose-finger and heel-to-shin bilaterally.  Gait and station: Gait is normal. Tandem gait is normal. Romberg is negative. No drift is seen.  Reflexes: Deep tendon reflexes are symmetric and normal bilaterally.   DIAGNOSTIC DATA (LABS, IMAGING, TESTING) - I reviewed patient records, labs, notes, testing and imaging myself where available.  Lab Results  Component Value Date   WBC 13.5 (H) 06/28/2006   HGB 11.0 (L) 06/28/2006   HCT 31.7 (L) 06/28/2006   MCV 90.1 06/28/2006   PLT 146 (L) 06/28/2006      Component Value Date/Time   NA 140 05/14/2016 1355   K 4.5 05/14/2016 1355   CL 99 05/14/2016 1355   CO2 20 05/14/2016 1355   GLUCOSE 100 (H) 05/14/2016 1355   BUN 11 05/14/2016 1355   CREATININE 0.77 05/14/2016 1355   CALCIUM 9.5 05/14/2016 1355   PROT 6.8 05/14/2016 1355   ALBUMIN 4.6 05/14/2016 1355   AST 14 05/14/2016 1355   ALT 8 05/14/2016 1355   ALKPHOS 47 05/14/2016 1355   BILITOT 0.5 05/14/2016 1355   GFRNONAA 97 05/14/2016 1355   GFRAA 112 05/14/2016 1355      ASSESSMENT AND PLAN 41 y.o. 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 here with :  1.  Narcolepsy  Overall the patient is doing  well.  She will continue on Xyrem.  I will check blood work today.  She is advised that if her symptoms worsen or she develops new symptoms she should let us know.  She will follow-up in 6 months or sooner if needed.  I spent 15 minutes with the patient. 50% of this time was spent discussing her medication.   Butch PennyMegan Markeria Goetsch, MSN, NP-C 11/18/2016, 2:10 PM [facility] [address], [address] [place], [place] [address] [phone]

## 2016-11-19 ENCOUNTER — Telehealth: Payer: Self-pay | Admitting: *Deleted

## 2016-11-19 LAB — COMPREHENSIVE METABOLIC PANEL
ALT: 9 IU/L (ref 0–32)
AST: 14 IU/L (ref 0–40)
Albumin/Globulin Ratio: 2.1 (ref 1.2–2.2)
Albumin: 4.7 g/dL (ref 3.5–5.5)
Alkaline Phosphatase: 48 IU/L (ref 39–117)
BUN/Creatinine Ratio: 21 (ref 9–23)
BUN: 16 mg/dL (ref 6–24)
Bilirubin Total: 0.4 mg/dL (ref 0.0–1.2)
CALCIUM: 9.5 mg/dL (ref 8.7–10.2)
CO2: 22 mmol/L (ref 20–29)
CREATININE: 0.77 mg/dL (ref 0.57–1.00)
Chloride: 105 mmol/L (ref 96–106)
GFR calc Af Amer: 111 mL/min/{1.73_m2} (ref >59)
GFR, EST NON AFRICAN AMERICAN: 96 mL/min/{1.73_m2} (ref >59)
GLOBULIN, TOTAL: 2.2 g/dL (ref 1.5–4.5)
Glucose: 88 mg/dL (ref 65–99)
Potassium: 4.2 mmol/L (ref 3.5–5.2)
SODIUM: 143 mmol/L (ref 134–144)
Total Protein: 6.9 g/dL (ref 6.0–8.5)

## 2016-11-19 NOTE — Telephone Encounter (Signed)
Spoke with patient and informed her that her labs are unremarkable. She verbalized understanding, appreciation. 

## 2016-12-12 ENCOUNTER — Telehealth: Payer: Self-pay

## 2016-12-12 NOTE — Telephone Encounter (Signed)
I received a prior auth request for xyrem. I have completed and submitted the PA and should have a determination within 48-72 hours.  Key: [id number]

## 2016-12-19 NOTE — Telephone Encounter (Signed)
Appeal has been written and sent for the patient to [employer]

## 2016-12-19 NOTE — Telephone Encounter (Signed)
Per cover my meds, the Xyrem has been denied.

## 2017-01-16 NOTE — Telephone Encounter (Signed)
Joan Foley/[employer] [phone] called wanting to know the status of the appeal. [facility] any information. Please call to advise

## 2017-01-16 NOTE — Telephone Encounter (Signed)
Still waiting for them to respond to the appeal letter that was sent in on 12/13. If we haven't heard anything by next week we can call as they technically have up to 30 days. With holidays i'm not sure if this caused a delay.

## 2017-01-28 ENCOUNTER — Telehealth: Payer: Self-pay | Admitting: Neurology

## 2017-01-28 NOTE — Telephone Encounter (Signed)
PA submitted through new insurance [employer] through cover my meds. PA is for Xyrem. KEY: [id number]. Can take up to 72 hours before hearing a response

## 2017-02-11 NOTE — Telephone Encounter (Signed)
PA has been denied. Will have to submit a appeal. This can take up to 30 days before hearing back.

## 2017-02-21 ENCOUNTER — Ambulatory Visit (HOSPITAL_BASED_OUTPATIENT_CLINIC_OR_DEPARTMENT_OTHER)
Admission: RE | Admit: 2017-02-21 | Discharge: 2017-02-21 | Disposition: A | Discharge status: Home / Self Care | Payer: 59 | Source: Ambulatory Visit | Attending: Family Medicine | Admitting: Family Medicine

## 2017-02-21 ENCOUNTER — Other Ambulatory Visit (HOSPITAL_BASED_OUTPATIENT_CLINIC_OR_DEPARTMENT_OTHER): Payer: Self-pay | Admitting: Family Medicine

## 2017-02-21 DIAGNOSIS — R102 Pelvic and perineal pain: Secondary | ICD-10-CM

## 2017-02-21 DIAGNOSIS — R109 Unspecified abdominal pain: Secondary | ICD-10-CM | POA: Insufficient documentation

## 2017-02-21 DIAGNOSIS — N83201 Unspecified ovarian cyst, right side: Secondary | ICD-10-CM | POA: Diagnosis not present

## 2017-02-21 DIAGNOSIS — Z975 Presence of (intrauterine) contraceptive device: Secondary | ICD-10-CM | POA: Insufficient documentation

## 2017-02-21 DIAGNOSIS — N854 Malposition of uterus: Secondary | ICD-10-CM | POA: Insufficient documentation

## 2017-03-11 NOTE — Telephone Encounter (Signed)
Called [employer] and they approved this medication from 03/03/17-05/31/17. PA approved [id number] CEU.

## 2017-03-18 ENCOUNTER — Other Ambulatory Visit: Payer: Self-pay | Admitting: Neurology

## 2017-03-18 DIAGNOSIS — G47411 Narcolepsy with cataplexy: Secondary | ICD-10-CM

## 2017-03-18 MED ORDER — SODIUM OXYBATE 500 MG/ML PO SOLN: 4500.0000 mg | ORAL | 5 refills | Status: DC

## 2017-05-06 ENCOUNTER — Telehealth: Payer: Self-pay | Admitting: Neurology

## 2017-05-06 NOTE — Telephone Encounter (Signed)
PA approved until 05/07/2018 for the Xyrem through [employer]/ [employer] [id number]

## 2017-05-06 NOTE — Telephone Encounter (Signed)
PA completed through cover my meds/[employer]. Can take up to 72 hours before hearing back. [id number]

## 2017-05-19 ENCOUNTER — Ambulatory Visit: Payer: BLUE CROSS/BLUE SHIELD | Admitting: Neurology

## 2017-05-19 ENCOUNTER — Encounter: Payer: Self-pay | Admitting: Neurology

## 2017-05-19 VITALS — BP 106/69 | HR 68 | Ht 64.0 in | Wt 150.0 lb

## 2017-05-19 DIAGNOSIS — G47411 Narcolepsy with cataplexy: Secondary | ICD-10-CM | POA: Diagnosis not present

## 2017-05-19 DIAGNOSIS — Z5181 Encounter for therapeutic drug level monitoring: Secondary | ICD-10-CM | POA: Diagnosis not present

## 2017-05-19 DIAGNOSIS — Z79899 Other long term (current) drug therapy: Secondary | ICD-10-CM | POA: Diagnosis not present

## 2017-05-19 MED ORDER — SODIUM OXYBATE 500 MG/ML PO SOLN: 4500.0000 mg | ORAL | 5 refills | Status: DC

## 2017-05-19 MED ORDER — CLONAZEPAM 1 MG PO TABS: ORAL_TABLET | ORAL | 1 refills | Status: DC

## 2017-05-19 NOTE — Patient Instructions (Signed)
As discussed, Xyrem has to be taken with very mindful caution: Taking Xyrem correctly is key. This means, take it only when you are fully ready to fall asleep, while in bed and refrain from doing any other activities, even brushing  your teeth after taking your first dose. The second dose will be about 2-1/2-4 hours after his first dose. You can go to the bathroom before your 2nd dose. Take your first dose, when actually IN BED, ready to sleep. No sitting up in bed, NO reading, NO using the cell phone or computer, NO getting up to use the bathroom. Take care of everything BEFORE sleep time. Try NOT to skip the second dose as the Xyrem is not going to stay in your system long enough with only one dose. Do not drink alcohol with Xyrem. If you do drink Alcohol, you cannot take your Xyrem doses that night.   

## 2017-05-19 NOTE — Progress Notes (Signed)
PATIENT: Joan Foley                                                        SLEEP MEDICINE CLINIC DOB: Jul 31, 1975  REASON FOR VISIT: follow up- narcolepsy HISTORY FROM: patient  HISTORY OF PRESENT ILLNESS:  I had the pleasure of seeing Joan Foley, a 42 year old Married mother of two for a routine revisit q 6 months.  The patient has been a longtime user of Xyrem ( 10.5 years )  but due to insurance coverage disputes was unable to take Xyrem for almost 3 months at the beginning of the year 2019.   In that time she became excessively daytime sleepy, excessively fatigued, her work performance suffered, she suffered from vivid , and anxiety provoking dreams- also gained a significant amount of weight for that short-term.  She was happy to restart Xyrem but now struggles still with a gained weight.   She endorsed the fatigue severity score today is 25 points of the Epworth Sleepiness Scale at 9 points while being back on Xyrem 4.5 gram twice at night after re-titration.  She needs today blood test for LFTs and prescription.    MM; 11/ 2018. 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  She returns today for follow-up.  She remains on Xyrem.  She states that she is tolerating this well.  She states that she has not been sleeping as well at night due to increased stress at her job.  She states that she is in the process of looking for new job.  She states that she is not falling asleep during the day.  She does state that there sometimes she has the desire to go to sleep but is not falling asleep.  She denies any significant depression.  She returns today for an evaluation.  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 She returns today for follow-up. She continues to take Xyrem and is tolerating it well. She denies any significant daytime sleepiness. She is able to work and drive without falling asleep. She denies  depression or suicidal thoughts. She is able to complete all ADLs independently. Her fatigue severity score is 23 an Epworth sleepiness score is 7. She continues to take Klonopin at bedtime. She returns today for an evaluation.  REVIEW OF SYSTEMS: Out of a complete 14 system review of symptoms, the patient complains only of the following symptoms, and all other reviewed systems are negative.  EPWORTH SLEEPINESS SCORE at 9,     FATIGUE SEVERITY SCORE: 25.    11-15-2015 Joan Foley is a 42 year-old caucasian married female with a history of narcolepsy. She is on XYREM, now with patient assistance. After an insurance change she had been unable to obtain Xyrem for 3 or 4 months and felt miserable. Her excessive daytime sleepiness returned and after that. She had to be re-titrated to Xyrem which she now takes at a dose of 4.5 g twice nightly. The patient reports that she has not been EDS since returning to [facility].  She states that she's been back on the medication for approximately 7 month. She states that things are returning to normal. Her Epworth sleepiness score is again  7 and fatigue severity score is 31.  She states that she takes her first dose before bedtime at 10 PM and wakes up at 1 AM to take her second dose. She has a firm routine. She continues to take Klonopin 0.5 mg daily for.  Denies depression. She denies any new neurological symptoms. She does not experience any cataplexy. She has been on Xyrem since her now 91 year old son was 47 month year old.  Her last lab results from 05/16/2015 were reviewed today, therefore all in normal limits including liver transaminases, renal clearance, electrolytes.  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 She returns today for follow-up. The patient has been taking Xyrem for over 5 years with good benefit. Unfortunately recently Xyrem has not been approved by her insurance company. She states that she has gone 2  weeks without this medication. She states that she helps her sleep throughout the day. She has been trying to take more frequent breaks at work in order to avoid falling asleep while working. She states that she is now napping a lot especially on the weekends. Even with the map she does not feel rested. She states that she has a continuous feeling of exhaustion. She states that her brain feels foggy. She feels that she has a hard time constructing her thoughts while at work. In the past the patient has tried Nuvigil without benefit. The patient was unable to use stimulants such as Adderall due to increased heart rate. Patient states that she has increased heart rate with medication such as Sudafed. The patient is also on Klonopin. She uses this medication for anxiety. Over the last year she has decreased her Klonopin from 1 mg to 0.5 mg. She states that her anxiety did increase with the decrease in Klonopin however she has been trying to tolerate it. She returns today for an evaluation.   REVIEW OF SYSTEMS: Out of a complete 14 system review of symptoms, the patient complains only of the following symptoms, and all other reviewed systems are negative. See history of present illness  ALLERGIES: No Known Allergies  HOME MEDICATIONS: Outpatient Medications Prior to Visit  Medication Sig Dispense Refill  . clonazePAM (KLONOPIN) 1 MG tablet Take 1 tablet (1 mg total) by mouth daily. Take 1/2 tab po daily. 30 tablet 1  . lamoTRIgine (LAMICTAL) 100 MG tablet Take 100 mg by mouth daily.    . sertraline (ZOLOFT) 100 MG tablet Take 150 mg by mouth daily.   5  . Sodium Oxybate (XYREM) 500 MG/ML SOLN Take 9 mLs (4,500 mg total) by mouth as directed. 4.5 gram twice nightly 270 mL 5   No facility-administered medications prior to visit.     PAST MEDICAL HISTORY: Past Medical History:  Diagnosis Date  . Anxiety   . Depression   . Excessive sleepiness   . Gallstones   . Kidney stone   . Mastitis     after breast feeding  . 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 heart disease     valve  . LITHOTRIPSY     with renal stones    FAMILY HISTORY: Family History  Problem Relation Age of Onset  . Anxiety disorder Mother   . Depression Mother   . Narcolepsy Father   . Depression Father   . Anxiety disorder Father     SOCIAL HISTORY: Social History  Socioeconomic History  . Marital status: Married    Spouse name: scott  . Number of children: 2  . Years of education: college  . Highest education level: Not on file  Occupational History  . 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 Financial resource strain: Not on file  . Food insecurity:    Worry: Not on file    Inability: Not on file  . Transportation needs:    Medical: Not on file    Non-medical: Not on file  Tobacco Use  . Smoking status: Former Games developer  . Smokeless tobacco: Never Used  . Tobacco comment: quit smoking in 2005  Substance and Sexual Activity  . Alcohol use: Yes    Alcohol/week: 0.0 oz    Comment: socially  . Drug use: No  . Sexual activity: Not on file  Lifestyle  . Physical activity:    Days per week: Not on file    Minutes per session: Not on file  . Stress: Not on file  Relationships  . Social connections:    Talks on phone: Not on file    Gets together: Not on file    Attends religious service: Not on file    Active member of club or organization: Not on file    Attends meetings of clubs or organizations: Not on file    Relationship status: Not on file  . Intimate partner violence:    Fear of current or ex partner: Not on file    Emotionally abused: Not on file    Physically abused: Not on file    Forced sexual activity: Not on file  Other Topics Concern  . Not on file  Social History Narrative   Patient lives at home with her husband Joan Foley.)   Patient  works full time.   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   Caffeine none.      PHYSICAL EXAM  Vitals:   05/19/17 1323  BP: 106/69  Pulse: 68  Weight: 150 lb (68 kg)  Height:  (1.626 m)   Body mass index is 25.75 kg/m.   Neck size: 13.5 "  Mallampati- 3   Generalized: Well developed, in no acute distress, no edema , no rash.    Neurological examination:   Mentation: Alert oriented to time, place, and language fluent. Cranial nerve : Taste and smell sense were unchanged.   Pupils are equally round,  reactive to light. Extraocular movements were full. Facial sensation and strength were normal. Uvula and tongue move midline. Head turning and shoulder shrug  were normal and symmetric. Motor:  5 / 5 strength of all 4 extremities with symmetric motor tone is noted throughout.  Sensory: primary modalities are  intact in 4 extremities. Coordination: intact to finger-nose bilaterally.  Gait and station: intact , no falls. Reflexes: symmetric bilaterally.   DIAGNOSTIC DATA (LABS, IMAGING, TESTING) - I reviewed patient records, labs, notes, testing and imaging myself where available. She endorsed the fatigue severity score today is 25 points of the Epworth Sleepiness Scale at 9 points while being back on Xyrem.       Component Value Date/Time   NA 143 11/18/2016 1326   K 4.2 11/18/2016 1326   CL 105 11/18/2016 1326   CO2 22 11/18/2016 1326   GLUCOSE 88 11/18/2016 1326   BUN 16 11/18/2016 1326   CREATININE 0.77 11/18/2016 1326   CALCIUM 9.5 11/18/2016 1326   PROT 6.9 11/18/2016 1326   ALBUMIN 4.7 11/18/2016 1326  AST 14 11/18/2016 1326   ALT 9 11/18/2016 1326   ALKPHOS 48 11/18/2016 1326   BILITOT 0.4 11/18/2016 1326   GFRNONAA 96 11/18/2016 1326   GFRAA 111 11/18/2016 1326      ASSESSMENT AND PLAN 42 y.o. 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 here with:  1. Narcolepsy on Xyrem is very well controlled, no cataplectic attacks reported.  The patient liver function tests have been repeatedly checked every 6 months and have always been normal. She continues to work gainfully employed full-time, she and her husband raise 2 children now 49 and 7 years old. Nocturnal childcare has never been a problem, spouse is very supportive and Joan Foley fund was able to take Xyrem even while her youngest child was 69 month old. The anxiety disorder treatment with Klonopin has not interfered with her Xyrem therapy .   She will continue on Xyrem. I will check blood work today.  Patient advised that if her symptoms worsen or she develops any new symptoms she should let us know.  She will follow-up in 6 months with NP [facility] ,  alternating with me.    Melvyn Novas, MD   Diplomate of the ABSM, ABPN, fellow of the AASM.   05/19/2017, 1:44 PM [facility] [address] [place], [place] [address] [phone]

## 2017-05-20 LAB — COMPREHENSIVE METABOLIC PANEL
A/G RATIO: 2.1 (ref 1.2–2.2)
ALBUMIN: 4.7 g/dL (ref 3.5–5.5)
ALT: 11 IU/L (ref 0–32)
AST: 20 IU/L (ref 0–40)
Alkaline Phosphatase: 53 IU/L (ref 39–117)
BILIRUBIN TOTAL: 0.4 mg/dL (ref 0.0–1.2)
BUN / CREAT RATIO: 20 (ref 9–23)
BUN: 15 mg/dL (ref 6–24)
CALCIUM: 10 mg/dL (ref 8.7–10.2)
CO2: 21 mmol/L (ref 20–29)
Chloride: 101 mmol/L (ref 96–106)
Creatinine, Ser: 0.74 mg/dL (ref 0.57–1.00)
GFR, EST AFRICAN AMERICAN: 116 mL/min/{1.73_m2} (ref >59)
GFR, EST NON AFRICAN AMERICAN: 101 mL/min/{1.73_m2} (ref >59)
Globulin, Total: 2.2 g/dL (ref 1.5–4.5)
Glucose: 83 mg/dL (ref 65–99)
Potassium: 4.9 mmol/L (ref 3.5–5.2)
Sodium: 138 mmol/L (ref 134–144)
TOTAL PROTEIN: 6.9 g/dL (ref 6.0–8.5)

## 2017-05-21 ENCOUNTER — Telehealth: Payer: Self-pay | Admitting: Neurology

## 2017-05-21 NOTE — Telephone Encounter (Signed)
-----   Message from Melvyn Novas, MD sent at 05/20/2017  8:37 AM EDT ----- Absolutely normal CMET - remain on XYREM.

## 2017-05-21 NOTE — Telephone Encounter (Signed)
Called the pt to review lab work with her and informed her they were normal and to continue using Xyrem. Pt verbalized understanding. Pt had no questions at this time but was encouraged to call back if questions arise.

## 2017-10-28 ENCOUNTER — Other Ambulatory Visit: Payer: Self-pay | Admitting: Neurology

## 2017-10-28 DIAGNOSIS — G47411 Narcolepsy with cataplexy: Secondary | ICD-10-CM

## 2017-10-28 MED ORDER — SODIUM OXYBATE 500 MG/ML PO SOLN: 4500.0000 mg | ORAL | 5 refills | Status: DC

## 2017-11-19 ENCOUNTER — Encounter: Payer: Self-pay | Admitting: Neurology

## 2017-11-19 ENCOUNTER — Ambulatory Visit: Payer: 59 | Admitting: Neurology

## 2017-11-19 VITALS — BP 105/64 | HR 63 | Ht 64.0 in | Wt 152.0 lb

## 2017-11-19 DIAGNOSIS — G47411 Narcolepsy with cataplexy: Secondary | ICD-10-CM

## 2017-11-19 DIAGNOSIS — Z5181 Encounter for therapeutic drug level monitoring: Secondary | ICD-10-CM | POA: Diagnosis not present

## 2017-11-19 DIAGNOSIS — N951 Menopausal and female climacteric states: Secondary | ICD-10-CM | POA: Insufficient documentation

## 2017-11-19 MED ORDER — SODIUM OXYBATE 500 MG/ML PO SOLN: 4500.0000 mg | ORAL | 5 refills | Status: DC

## 2017-11-19 NOTE — Progress Notes (Signed)
PATIENT: Joan Foley [facility]                                              SLEEP MEDICINE CLINIC DOB: 10-09-1975  REASON FOR VISIT: follow up- narcolepsy HISTORY FROM: patient  HISTORY OF PRESENT ILLNESS:  11-19-2017, Joan Foley is a meanwhile 42 year old female patient with narcolepsy on XYREM, treated since her youngest child was 42 month old. She is a litlle more sleepy earlier in the evening than she is used to, but no negative effect . She reports hot flashes at night, and less restorative sleep, but no assocaited mood changes. She had been on Mirena IUD for endometriosis.  Here for refills.   I had the pleasure of seeing Mrs. Joan George. Foley, a 56 year old Married mother of two for a routine revisit q 6 months.  The patient has been a longtime user of Xyrem ( 10.5 years )  but due to insurance coverage disputes was unable to take Xyrem for almost 3 months at the beginning of the year 2019.   In that time she became excessively daytime sleepy, excessively fatigued, her work performance suffered, she suffered from vivid , and anxiety provoking dreams- also gained a significant amount of weight for that short-term.  She was happy to restart Xyrem but now struggles still with a gained weight.   She endorsed the fatigue severity score today is 25 points of the Epworth Sleepiness Scale at 9 points while being back on Xyrem 4.5 gram twice at night after re-titration.  She needs today blood test for LFTs and prescription.    MM; 11/ 2018. Ms. 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  She returns today for follow-up.  She remains on Xyrem.  She states that she is tolerating this well.  She states that she has not been sleeping as well at night due to increased stress at her job.  She states that she is in the process of looking for new job.  She states that she is not falling asleep during the day.  She does state that there sometimes she has the desire to go to sleep  but is not falling asleep.  She denies any significant depression.  She returns today for an evaluation.  HISTORY 05/14/16: Ms. 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 She returns today for follow-up. She continues to take Xyrem and is tolerating it well. She denies any significant daytime sleepiness. She is able to work and drive without falling asleep. She denies depression or suicidal thoughts. She is able to complete all ADLs independently. Her fatigue severity score is 23 an Epworth sleepiness score is 7. She continues to take Klonopin at bedtime. She returns today for an evaluation.     11-15-2015 Ms. Joan Foley is a 42 year-old caucasian married female with a history of narcolepsy. She is on XYREM, now with patient assistance. After an insurance change she had been unable to obtain Xyrem for 3 or 4 months and felt miserable.  Her excessive daytime sleepiness returned and after that. She had to be re-titrated to Xyrem which she now takes at a dose of 4.5 g twice nightly. The patient reports that she has not been EDS since returning to [facility].  She states that she's been back on the medication for approximately 7  month. She states that things are returning to normal. Her Epworth sleepiness score is again  7 and fatigue severity score is 31. She states that she takes her first dose before bedtime at 10 PM and wakes up at 1 AM to take her second dose. She has a firm routine. She continues to take Klonopin 0.5 mg daily for.  Denies depression. She denies any new neurological symptoms. She does not experience any cataplexy. She has been on Xyrem since her now 53 year old son was 27 month year old.  Her last lab results from 05/16/2015 were reviewed today, therefore all in normal limits including liver transaminases, renal clearance, electrolytes.   REVIEW OF SYSTEMS: Out of a complete 14 system review of symptoms, the patient complains only of the following symptoms, and all other  reviewed systems are negative.  EPWORTH SLEEPINESS SCORE at 9/ 24  points ,     FATIGUE SEVERITY SCORE: 25/ 65 points, no cataplexy.    ALLERGIES: No Known Allergies HTN on Sudafed.   HOME MEDICATIONS: Outpatient Medications Prior to Visit  Medication Sig Dispense Refill  . clonazePAM (KLONOPIN) 1 MG tablet Take 1/2 tab po in AM - not at bedtime. 30 tablet 1  . lamoTRIgine (LAMICTAL) 100 MG tablet Take 100 mg by mouth daily.    . sertraline (ZOLOFT) 100 MG tablet Take 150 mg by mouth daily.   5  . Sodium Oxybate (XYREM) 500 MG/ML SOLN Take 9 mLs (4,500 mg total) by mouth as directed. 4.5 gram twice nightly 270 mL 5   No facility-administered medications prior to visit.     PAST MEDICAL HISTORY: Past Medical History:  Diagnosis Date  . Anxiety   . Depression   . Excessive sleepiness   . Gallstones   . Kidney stone   . Mastitis    after breast feeding  . 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 heart disease     valve  . LITHOTRIPSY     with renal stones    FAMILY HISTORY: Family History  Problem Relation Age of Onset  . Anxiety disorder Mother   . Depression Mother   . Narcolepsy Father   . Depression Father   . Anxiety disorder Father     SOCIAL HISTORY: Social History   Socioeconomic History  . Marital status: Married    Spouse name: scott  . Number of children: 2  . Years of education: college  . Highest education level: Not on file  Occupational History  . 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 Financial resource strain: Not on file  . Food insecurity:    Worry: Not on file    Inability: Not on file  . Transportation needs:    Medical: Not on file    Non-medical: Not on file  Tobacco Use  . Smoking status: Former Games developer  . Smokeless tobacco: Never Used  . Tobacco comment: quit smoking in 2005  Substance and Sexual  Activity  . Alcohol use: Yes    Alcohol/week: 0.0 standard drinks    Comment: socially  . Drug use: No  . Sexual activity: Not on file  Lifestyle  . Physical activity:    Days per week: Not on file    Minutes per session: Not on file  . Stress: Not on file  Relationships  . Social connections:  Talks on phone: Not on file    Gets together: Not on file    Attends religious service: Not on file    Active member of club or organization: Not on file    Attends meetings of clubs or organizations: Not on file    Relationship status: Not on file  . Intimate partner violence:    Fear of current or ex partner: Not on file    Emotionally abused: Not on file    Physically abused: Not on file    Forced sexual activity: Not on file  Other Topics Concern  . Not on file  Social History Narrative   Patient lives at home with her husband [name].)   Patient works full time.   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   Caffeine none.      PHYSICAL EXAM  Vitals:   11/19/17 1311  BP: 105/64  Pulse: 63  Weight: 152 lb (68.9 kg)  Height: 5\' 4"  (1.626 m)   Body mass index is 26.09 kg/m.   Neck size: 13.5 "  Mallampati- 3   Generalized: Well developed, in no acute distress, no edema , no rash.    Neurological examination:   Mentation: Alert oriented to time, place, and language fluent. Cranial nerve : Taste and smell sense were unchanged.   Pupils are equally round,  reactive to light. Extraocular movements were full. Facial sensation and strength were normal. Uvula and tongue move midline. Head turning and shoulder shrug  were normal and symmetric. Motor:  5 / 5 strength of all 4 extremities with symmetric motor tone is noted throughout.  Sensory: primary modalities are  intact in 4 extremities. Coordination: intact to finger-nose bilaterally.  Gait and station: intact , no falls. Reflexes: symmetric bilaterally.   DIAGNOSTIC DATA (LABS, IMAGING, TESTING) - I reviewed patient  records, labs, notes, testing and imaging myself where available. She endorsed the fatigue severity score today is 25 points of the Epworth Sleepiness Scale at 9 points while being back on Xyrem.       Component Value Date/Time   NA 138 05/19/2017 1410   K 4.9 05/19/2017 1410   CL 101 05/19/2017 1410   CO2 21 05/19/2017 1410   GLUCOSE 83 05/19/2017 1410   BUN 15 05/19/2017 1410   CREATININE 0.74 05/19/2017 1410   CALCIUM 10.0 05/19/2017 1410   PROT 6.9 05/19/2017 1410   ALBUMIN 4.7 05/19/2017 1410   AST 20 05/19/2017 1410   ALT 11 05/19/2017 1410   ALKPHOS 53 05/19/2017 1410   BILITOT 0.4 05/19/2017 1410   GFRNONAA 101 05/19/2017 1410   GFRAA 116 05/19/2017 1410      ASSESSMENT AND PLAN 42 y.o. 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 here with:  1. Narcolepsy on Xyrem is very well controlled. 9 years on XYREM.    Again, no cataplectic attacks reported.  The patient liver function tests have been repeatedly checked (every 6 months) and have always been normal.  She continues to work gainfully employed full-time, she and her husband raise 2 children now 10 and 75 years old.  Nocturnal childcare has never been a problem, spouse is very supportive and Mrs. Bonnett fund was able to take Xyrem even while her youngest child was 71 month old. The anxiety disorder treatment with Klonopin has not interfered with her Xyrem therapy .   She will continue on Xyrem.  I will check blood work today.  As discussed, Xyrem has to be taken with very mindful caution: Taking Xyrem correctly is key. This means, take it only when you are fully ready to fall asleep, while in bed and refrain from doing any other activities, even brushing  your teeth after taking your first dose. The second dose will be about 2-1/2-4 hours after his first dose. You can go to the  bathroom before your 2nd dose. Take your first dose, when actually IN BED, ready to sleep. No sitting up in bed, NO reading, NO using the cell phone or computer, NO getting up to use the bathroom. Take care of everything BEFORE sleep time. Try NOT to skip the second dose as the Xyrem is not going to stay in your system long enough with only one dose. Do not drink alcohol with Xyrem. If you do drink Alcohol, you cannot take your Xyrem doses that night.   .  [facility] with me.    Melvyn Novasarmen Kazden Largo, MD   Diplomate of the ABSM, ABPN, Fellow of the AASM.   11/19/2017, 1:57 PM [facility] [address], [address] [place], [place] [id number] [phone]

## 2017-11-19 NOTE — Patient Instructions (Signed)
Menopause Menopause is the normal time of life when menstrual periods stop completely. Menopause is complete when you have missed 12 consecutive menstrual periods. It usually occurs between the ages of 48 years and 55 years. Very rarely does a woman develop menopause before the age of 40 years. At menopause, your ovaries stop producing the female hormones estrogen and progesterone. This can cause undesirable symptoms and also affect your health. Sometimes the symptoms may occur 4-5 years before the menopause begins. There is no relationship between menopause and:  Oral contraceptives.  Number of children you had.  Race.  The age your menstrual periods started (menarche).  Heavy smokers and very thin women may develop menopause earlier in life. What are the causes?  The ovaries stop producing the female hormones estrogen and progesterone. Other causes include:  Surgery to remove both ovaries.  The ovaries stop functioning for no known reason.  Tumors of the pituitary gland in the brain.  Medical disease that affects the ovaries and hormone production.  Radiation treatment to the abdomen or pelvis.  Chemotherapy that affects the ovaries.  What are the signs or symptoms?  Hot flashes.  Night sweats.  Decrease in sex drive.  Vaginal dryness and thinning of the vagina causing painful intercourse.  Dryness of the skin and developing wrinkles.  Headaches.  Tiredness.  Irritability.  Memory problems.  Weight gain.  Bladder infections.  Hair growth of the face and chest.  Infertility. More serious symptoms include:  Loss of bone (osteoporosis) causing breaks (fractures).  Depression.  Hardening and narrowing of the arteries (atherosclerosis) causing heart attacks and strokes.  How is this diagnosed?  When the menstrual periods have stopped for 12 straight months.  Physical exam.  Hormone studies of the blood. How is this treated? There are many treatment  choices and nearly as many questions about them. The decisions to treat or not to treat menopausal changes is an individual choice made with your health care provider. Your health care provider can discuss the treatments with you. Together, you can decide which treatment will work best for you. Your treatment choices may include:  Hormone therapy (estrogen and progesterone).  Non-hormonal medicines.  Treating the individual symptoms with medicine (for example antidepressants for depression).  Herbal medicines that may help specific symptoms.  Counseling by a psychiatrist or psychologist.  Group therapy.  Lifestyle changes including: ? Eating healthy. ? Regular exercise. ? Limiting caffeine and alcohol. ? Stress management and meditation.  No treatment.  Follow these instructions at home:  Take the medicine your health care provider gives you as directed.  Get plenty of sleep and rest.  Exercise regularly.  Eat a diet that contains calcium (good for the bones) and soy products (acts like estrogen hormone).  Avoid alcoholic beverages.  Do not smoke.  If you have hot flashes, dress in layers.  Take supplements, calcium, and vitamin D to strengthen bones.  You can use over-the-counter lubricants or moisturizers for vaginal dryness.  Group therapy is sometimes very helpful.  Acupuncture may be helpful in some cases. Contact a health care provider if:  You are not sure you are in menopause.  You are having menopausal symptoms and need advice and treatment.  You are still having menstrual periods after age 55 years.  You have pain with intercourse.  Menopause is complete (no menstrual period for 12 months) and you develop vaginal bleeding.  You need a referral to a specialist (gynecologist, psychiatrist, or psychologist) for treatment. Get help right   away if:  You have severe depression.  You have excessive vaginal bleeding.  You fell and think you have a  broken bone.  You have pain when you urinate.  You develop leg or chest pain.  You have a fast pounding heart beat (palpitations).  You have severe headaches.  You develop vision problems.  You feel a lump in your breast.  You have abdominal pain or severe indigestion. This information is not intended to replace advice given to you by your health care provider. Make sure you discuss any questions you have with your health care provider. Document Released: 03/16/2003 Document Revised: 06/01/2015 Document Reviewed: 07/23/2012 Elsevier Interactive Patient Education  2017 Elsevier Inc.  

## 2017-11-20 ENCOUNTER — Telehealth: Payer: Self-pay | Admitting: Neurology

## 2017-11-20 LAB — COMPREHENSIVE METABOLIC PANEL
ALT: 15 IU/L (ref 0–32)
AST: 17 IU/L (ref 0–40)
Albumin/Globulin Ratio: 2.4 — ABNORMAL HIGH (ref 1.2–2.2)
Albumin: 5.2 g/dL (ref 3.5–5.5)
Alkaline Phosphatase: 69 IU/L (ref 39–117)
BILIRUBIN TOTAL: 0.4 mg/dL (ref 0.0–1.2)
BUN/Creatinine Ratio: 21 (ref 9–23)
BUN: 17 mg/dL (ref 6–24)
CHLORIDE: 99 mmol/L (ref 96–106)
CO2: 24 mmol/L (ref 20–29)
Calcium: 10 mg/dL (ref 8.7–10.2)
Creatinine, Ser: 0.81 mg/dL (ref 0.57–1.00)
GFR calc non Af Amer: 90 mL/min/{1.73_m2} (ref >59)
GFR, EST AFRICAN AMERICAN: 104 mL/min/{1.73_m2} (ref >59)
GLUCOSE: 80 mg/dL (ref 65–99)
Globulin, Total: 2.2 g/dL (ref 1.5–4.5)
Potassium: 4.5 mmol/L (ref 3.5–5.2)
Sodium: 140 mmol/L (ref 134–144)
TOTAL PROTEIN: 7.4 g/dL (ref 6.0–8.5)

## 2017-11-20 NOTE — Telephone Encounter (Signed)
Called the patient to let her know that lab work overall looked good with exception of albumin slightly elevated.  No answer. LVM with this information and advised there was no concern from Dr Oliva Bustardohmeier's standpoint and that was ok to continue Xyrem. Instructed the patient to call if she has questions.

## 2017-11-20 NOTE — Telephone Encounter (Signed)
-----   Message from Melvyn Novasarmen Dohmeier, MD sent at 11/20/2017  8:26 AM EST ----- High albumin is not an abnormality we need to worry about in [facility] use.  LFT ( liver transaminases) are normal, kidney function is good.

## 2018-03-31 ENCOUNTER — Other Ambulatory Visit: Payer: Self-pay | Admitting: Neurology

## 2018-03-31 DIAGNOSIS — G47411 Narcolepsy with cataplexy: Secondary | ICD-10-CM

## 2018-03-31 MED ORDER — SODIUM OXYBATE 500 MG/ML PO SOLN: 4500.0000 mg | ORAL | 5 refills | Status: DC

## 2018-04-06 ENCOUNTER — Telehealth: Payer: Self-pay | Admitting: *Deleted

## 2018-04-06 NOTE — Telephone Encounter (Signed)
Initiated PA Xyrem 500mg /ml on CMM. Key: [id number]. In process of completing.

## 2018-04-06 NOTE — Telephone Encounter (Signed)
PA submitted. Waiting on determination.  

## 2018-04-06 NOTE — Telephone Encounter (Signed)
Request Reference Number: [id number]. XYREM SOL 500MG /ML is approved through 04/06/2019. For further questions, call [phone]

## 2018-05-14 ENCOUNTER — Telehealth: Payer: Self-pay | Admitting: Neurology

## 2018-05-14 NOTE — Telephone Encounter (Signed)
Due to current COVID 19 pandemic, our office is severely reducing in office visits until further notice, in order to minimize the risk to our patients and healthcare providers.   Called patient to offer a virtual visit with Dr. Vickey Huger. Patient's appointment is set for 5/13. Patient agreed to a virtual visit and verbalized understanding of the doxy process. I have sent patient an e-mail with link and directions. My name and office number/hours were also included if patient has questions. Patient is aware that she will receive a call from nurse to update chart info.  Pt understands that although there may be some limitations with this type of visit, we will take all precautions to reduce any security or privacy concerns.  Pt understands that this will be treated like an in office visit and we will file with pt's insurance, and there may be a patient responsible charge related to this service.

## 2018-05-18 ENCOUNTER — Encounter: Payer: Self-pay | Admitting: Neurology

## 2018-05-18 NOTE — Telephone Encounter (Signed)

## 2018-05-20 ENCOUNTER — Encounter: Payer: Self-pay | Admitting: Neurology

## 2018-05-20 ENCOUNTER — Ambulatory Visit (INDEPENDENT_AMBULATORY_CARE_PROVIDER_SITE_OTHER): Payer: 59 | Admitting: Neurology

## 2018-05-20 ENCOUNTER — Other Ambulatory Visit: Payer: Self-pay

## 2018-05-20 DIAGNOSIS — G47411 Narcolepsy with cataplexy: Secondary | ICD-10-CM

## 2018-05-20 DIAGNOSIS — Z79899 Other long term (current) drug therapy: Secondary | ICD-10-CM

## 2018-05-20 DIAGNOSIS — R6889 Other general symptoms and signs: Secondary | ICD-10-CM

## 2018-05-20 DIAGNOSIS — G47419 Narcolepsy without cataplexy: Secondary | ICD-10-CM | POA: Diagnosis not present

## 2018-05-20 MED ORDER — SODIUM OXYBATE 500 MG/ML PO SOLN: 5000.0000 mg | ORAL | 5 refills | Status: DC

## 2018-05-20 NOTE — Patient Instructions (Addendum)
     LABS to be drawn at [facility] this week.    As discussed, Xyrem has to be taken with very mindful caution: Taking Xyrem correctly is key. This means, take it only when you are fully ready to fall asleep, while in bed and refrain from doing any other activities, even brushing  your teeth after taking your first dose. The second dose will be about 2-1/2-4 hours after his first dose. You can go to the bathroom before your 2nd dose. Take your first dose, when actually IN BED, ready to sleep. No sitting up in bed, NO reading, NO using the cell phone or computer, NO getting up to use the bathroom. Take care of everything BEFORE sleep time. Try NOT to skip the second dose as the Xyrem is not going to stay in your system long enough with only one dose. Do not drink alcohol with Xyrem. If you do drink Alcohol, you cannot take your Xyrem doses that night.

## 2018-05-20 NOTE — Progress Notes (Signed)
Virtual Visit via Video Note  I connected with Joan Foley on 05/20/18 at  1:00 PM EDT by a video enabled telemedicine application and verified that I am speaking with the correct person using two identifiers.  Location: Patient: in her car  Provider: at [facility]      I discussed the limitations of evaluation and management by telemedicine and the availability of in person appointments. The patient expressed understanding and agreed to proceed.      PATIENT: [facility]                                              SLEEP MEDICINE CLINIC DOB: 1975-09-18  REASON FOR VISIT: follow up- narcolepsy HISTORY FROM: patient   PCP is Dr Leodis Sias, MD   HISTORY OF PRESENT ILLNESS:  05-20-2018 patient reports a seasonal depression, more sleepy, not necessarily fatigued. But she wondered if Xyrem effect and half life may vary from badge to badge/ lot to lot ?  She feels with her last refill there was a difference in effect. Much less sleepy making, and her appetite soared.  Now on ESS endorsing an Epworth sleepiness score of 10/ 24 points, but significant afternoon sleepiness. Will increase the dose to 5 gram bid ( nightly) from 4.5 gram - if her insurance carrier [facility] permits.   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 She is a litlle more sleepy earlier in the evening than she is used to, but no negative effect . She reports hot flashes at night, and less restorative sleep, but no assocaited mood changes. She had been on Mirena IUD for endometriosis.  Here for refills. Narcolepsy on Xyrem is very well controlled. 10  years on XYREM, started 2010 .    Again, no cataplectic attacks reported.  The patient liver function tests have been repeatedly checked (every 6 months) and have always been normal.   She continues to work gainfully employed full-time, she and her husband raise 2  children now 81 and 57 years old.  Nocturnal childcare has never been a problem, spouse is very supportive and Joan Foley was able to take Xyrem even while her youngest child was 37 month old. The anxiety disorder treatment with Klonopin has not interfered with her Xyrem therapy .   I had the pleasure of seeing [name]. Foley, a 23 year old Married mother of two for a routine revisit q 6 months.  The patient has been a longtime user of Xyrem ( 10.5 years )  but due to insurance coverage disputes was unable to take Xyrem for almost 3 months at the beginning of the year 2019.   In that time she became excessively daytime sleepy, excessively fatigued, her work performance suffered, she suffered from vivid , and anxiety provoking dreams- also gained a significant amount of weight for that short-term.  She was happy to restart Xyrem but now struggles still with a gained weight.   She endorsed the fatigue severity score today is 25 points of the Epworth Sleepiness Scale at 9 points while being back on Xyrem 4.5 gram twice at night after re-titration.  She needs today blood test for LFTs and prescription.    MM; 11/ 2018. Joan Foley is a 43 year old female with a  history of narcolepsy.  She returns today for follow-up.  She remains on Xyrem.  She states that she is tolerating this well.  She states that she has not been sleeping as well at night due to increased stress at her job.  She states that she is in the process of looking for new job.  She states that she is not falling asleep during the day.  She does state that there sometimes she has the desire to go to sleep but is not falling asleep.  She denies any significant depression.  She returns today for an evaluation.  HISTORY 05/14/16: Joan Foley is a 43 year old female with a history of narcolepsy. She returns today for follow-up. She continues to take Xyrem and is tolerating it well. She denies any significant daytime sleepiness. She is  able to work and drive without falling asleep. She denies depression or suicidal thoughts. She is able to complete all ADLs independently. Her fatigue severity score is 23 an Epworth sleepiness score is 7. She continues to take Klonopin at bedtime. She returns today for an evaluation.     11-15-2015 Ms. Foley is a 43 year-old caucasian married female with a history of narcolepsy. She is on XYREM, now with patient assistance. After an insurance change she had been unable to obtain Xyrem for 3 or 4 months and felt miserable.  Her excessive daytime sleepiness returned and after that. She had to be re-titrated to Xyrem which she now takes at a dose of 4.5 g twice nightly. The patient reports that she has not been EDS [facility]  She states that she's been back on the medication for approximately 7 month. She states that things are returning to normal. Her Epworth sleepiness score is again  7 and fatigue severity score is 31. She states that she takes her first dose before bedtime at 10 PM and wakes up at 1 AM to take her second dose. She has a firm routine. She continues to take Klonopin 0.5 mg daily for.  Denies depression. She denies any new neurological symptoms. She does not experience any cataplexy. She has been on Xyrem since her now 43 year old son was 8018 month year old.  Her last lab results from 05/16/2015 were reviewed today, therefore all in normal limits including liver transaminases, renal clearance, electrolytes.   REVIEW OF SYSTEMS: Out of a complete 14 system review of symptoms, the patient complains only of the following symptoms, and all other reviewed systems are negative.  Anxiety   EPWORTH SLEEPINESS SCORE at 10/ 24  points ,     FATIGUE SEVERITY SCORE: 23/ 65 points,  Still no cataplexy.    ALLERGIES: No Known Allergies HTN on Sudafed.   HOME MEDICATIONS: Outpatient Medications Prior to Visit  Medication Sig Dispense Refill  . clonazePAM (KLONOPIN) 1 MG  tablet Take 1/2 tab po in AM - not at bedtime. 30 tablet 1  . lamoTRIgine (LAMICTAL) 100 MG tablet Take 100 mg by mouth daily.    . sertraline (ZOLOFT) 100 MG tablet Take 150 mg by mouth daily.   5  . Sodium Oxybate (XYREM) 500 MG/ML SOLN Take 9 mLs (4,500 mg total) by mouth as directed. 4.5 gram twice nightly 270 mL 5   No facility-administered medications prior to visit.     PAST MEDICAL HISTORY: Past Medical History:  Diagnosis Date  . Anxiety   . Depression   . Excessive sleepiness   . Gallstones   . Kidney stone   .  Mastitis    after breast feeding  . 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 heart disease     valve  . LITHOTRIPSY     with renal stones    FAMILY HISTORY: Family History  Problem Relation Age of Onset  . Anxiety disorder Mother   . Depression Mother   . Narcolepsy Father   . Depression Father   . Anxiety disorder Father     SOCIAL HISTORY: Social History   Socioeconomic History  . Marital status: Married    Spouse name: scott  . Number of children: 2  . Years of education: college  . Highest education level: Not on file  Occupational History  . 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 Financial resource strain: Not on file  . Food insecurity:    Worry: Not on file    Inability: Not on file  . Transportation needs:    Medical: Not on file    Non-medical: Not on file  Tobacco Use  . Smoking status: Former Games developer  . Smokeless tobacco: Never Used  . Tobacco comment: quit smoking in 2005  Substance and Sexual Activity  . Alcohol use: Yes    Alcohol/week: 0.0 standard drinks    Comment: socially  . Drug use: No  . Sexual activity: Not on file  Lifestyle  . Physical activity:    Days per week: Not on file    Minutes per session: Not on file  . Stress: Not on file  Relationships  . Social connections:     Talks on phone: Not on file    Gets together: Not on file    Attends religious service: Not on file    Active member of club or organization: Not on file    Attends meetings of clubs or organizations: Not on file    Relationship status: Not on file  . Intimate partner violence:    Fear of current or ex partner: Not on file    Emotionally abused: Not on file    Physically abused: Not on file    Forced sexual activity: Not on file  Other Topics Concern  . Not on file  Social History Narrative   Patient lives at home with her husband [name].)   Patient works full time.   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   Caffeine none.      Observation: Observations/Objective:    There were no vitals filed for this visit. There is no height or weight on file to calculate BMI.   Neck size: 13.5 "  Mallampati- 3   Generalized: Well developed, in no acute distress, no edema , no rash.     Mentation: Alert oriented to time, place, and language fluent.  She feels fatigued, and is more sleepy again.  Cranial nerve : Taste and smell sense were unchanged.   Pupils are equally round- Extraocular movements were full. Facial sensation and strength were normal. Uvula and tongue move midline. Head turning and shoulder shrug  were normal and symmetric. Motor:  5 / 5 strength and full ROM in  4 extremities Gait and station: intact , no falls.   DIAGNOSTIC DATA (LABS, IMAGING, TESTING) - I reviewed patient records, labs, notes, testing and imaging myself where available. She endorsed the fatigue severity score today is 25 points of the Epworth Sleepiness Scale at 9  points while being back on Xyrem.       Component Value Date/Time   NA 140 11/19/2017 1423   K 4.5 11/19/2017 1423   CL 99 11/19/2017 1423   CO2 24 11/19/2017 1423   GLUCOSE 80 11/19/2017 1423   BUN 17 11/19/2017 1423   CREATININE 0.81 11/19/2017 1423   CALCIUM 10.0 11/19/2017 1423   PROT 7.4 11/19/2017 1423   ALBUMIN 5.2  11/19/2017 1423   AST 17 11/19/2017 1423   ALT 15 11/19/2017 1423   ALKPHOS 69 11/19/2017 1423   BILITOT 0.4 11/19/2017 1423   GFRNONAA 90 11/19/2017 1423   GFRAA 104 11/19/2017 1423       Follow Up Instructions:    I discussed the assessment and treatment plan with the patient. The patient was provided an opportunity to ask questions and all were answered. The patient agreed with the plan and demonstrated an understanding of the instructions.   The patient was advised to call back or seek an in-person evaluation if the symptoms worsen or if the condition fails to improve as anticipated.  I provided 21 minutes of non-face-to-face time during this encounter.   Melvyn Novas, MD    ASSESSMENT AND PLAN   43 y.o. 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 here with:  1. Narcoleptic patient without cataplexy on XYREM - Recent resurgence of sleepiness, and of weight gain.   She will continue on Xyrem. I increased the dose  To 5 gram bid, she gained weight. I will check blood work outside the clinic- lab to be drawn in the near future/ she works close by.    As discussed, Xyrem has to be taken with very mindful caution: Taking Xyrem correctly is key. This means, take it only when you are fully ready to fall asleep, while in bed and refrain from doing any other activities, even brushing  your teeth after taking your first dose. The second dose will be about 2-1/2-4 hours after his first dose. You can go to the bathroom before your 2nd dose. Take your first dose, when actually IN BED, ready to sleep.  No sitting up in bed, NO reading, NO using the cell phone or computer, NO getting up to use the bathroom. Take care of everything BEFORE sleep time. Try NOT to skip the second dose as the Xyrem is not going to stay in your system long enough with  only one dose. Do not drink alcohol with Xyrem. If you do drink Alcohol, you cannot take your Xyrem doses that night.   .  She will follow-up in 5-6 months with NP [facility], alternating with me. If needed we meet virtually earlier than this.    Melvyn Novas, MD 05-20-2018 .  Diplomate of the ABSM, ABPN, Fellow of the AASM.   05/20/2018, 1:24 PM [facility] [address], [address] [place], [place] [address] [phone]

## 2018-05-21 ENCOUNTER — Ambulatory Visit: Payer: 59 | Admitting: Neurology

## 2018-05-21 ENCOUNTER — Encounter: Payer: Self-pay | Admitting: Neurology

## 2018-05-21 LAB — COMPREHENSIVE METABOLIC PANEL
ALT: 16 IU/L (ref 0–32)
AST: 19 IU/L (ref 0–40)
Albumin/Globulin Ratio: 2.1 (ref 1.2–2.2)
Albumin: 4.8 g/dL (ref 3.8–4.8)
Alkaline Phosphatase: 70 IU/L (ref 39–117)
BUN/Creatinine Ratio: 16 (ref 9–23)
BUN: 13 mg/dL (ref 6–24)
Bilirubin Total: 0.4 mg/dL (ref 0.0–1.2)
CO2: 21 mmol/L (ref 20–29)
Calcium: 9.4 mg/dL (ref 8.7–10.2)
Chloride: 103 mmol/L (ref 96–106)
Creatinine, Ser: 0.83 mg/dL (ref 0.57–1.00)
GFR calc Af Amer: 101 mL/min/{1.73_m2} (ref >59)
GFR calc non Af Amer: 87 mL/min/{1.73_m2} (ref >59)
Globulin, Total: 2.3 g/dL (ref 1.5–4.5)
Glucose: 86 mg/dL (ref 65–99)
Potassium: 4.7 mmol/L (ref 3.5–5.2)
Sodium: 141 mmol/L (ref 134–144)
Total Protein: 7.1 g/dL (ref 6.0–8.5)

## 2018-05-21 LAB — CBC WITH DIFFERENTIAL/PLATELET
Basophils Absolute: 0 10*3/uL (ref 0.0–0.2)
Basos: 1 %
EOS (ABSOLUTE): 0.1 10*3/uL (ref 0.0–0.4)
Eos: 2 %
Hematocrit: 40.8 % (ref 34.0–46.6)
Hemoglobin: 14.6 g/dL (ref 11.1–15.9)
Immature Grans (Abs): 0 10*3/uL (ref 0.0–0.1)
Immature Granulocytes: 0 %
Lymphocytes Absolute: 2.1 10*3/uL (ref 0.7–3.1)
Lymphs: 36 %
MCH: 32.3 pg (ref 26.6–33.0)
MCHC: 35.8 g/dL — ABNORMAL HIGH (ref 31.5–35.7)
MCV: 90 fL (ref 79–97)
Monocytes Absolute: 0.4 10*3/uL (ref 0.1–0.9)
Monocytes: 7 %
Neutrophils Absolute: 3.1 10*3/uL (ref 1.4–7.0)
Neutrophils: 54 %
Platelets: 207 10*3/uL (ref 150–450)
RBC: 4.52 x10E6/uL (ref 3.77–5.28)
RDW: 12.3 % (ref 11.7–15.4)
WBC: 5.8 10*3/uL (ref 3.4–10.8)

## 2018-05-21 LAB — T4 AND TSH
T4, Total: 6.8 ug/dL (ref 4.5–12.0)
TSH: 1.39 u[IU]/mL (ref 0.450–4.500)

## 2018-05-25 ENCOUNTER — Encounter: Payer: Self-pay | Admitting: Neurology

## 2018-08-08 IMAGING — US US PELVIS COMPLETE TRANSABD/TRANSVAG
1 series · 14 of 25 positions shown · non-contrast
Comparison: None

CLINICAL DATA: Midpelvic pain today.



[Series 1: us pelvis complete transabd/transvag · 0.25mm/px · 14 of 56 slices shown]
[im 1/56]
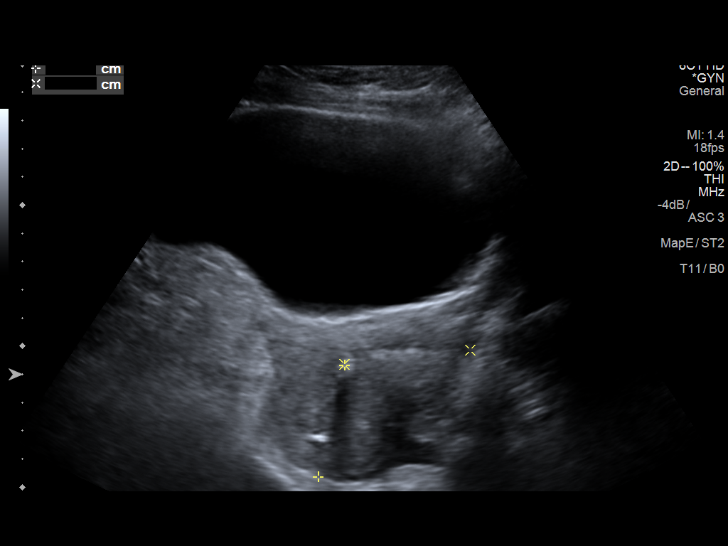
[im 5/56]
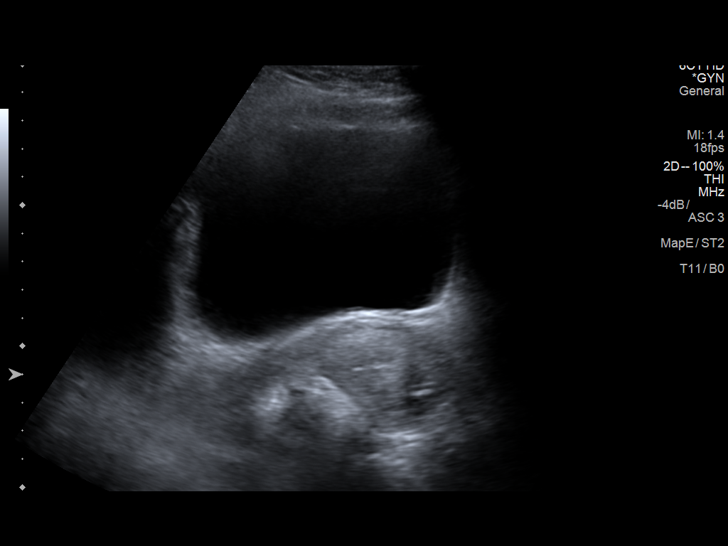
[im 10/56]
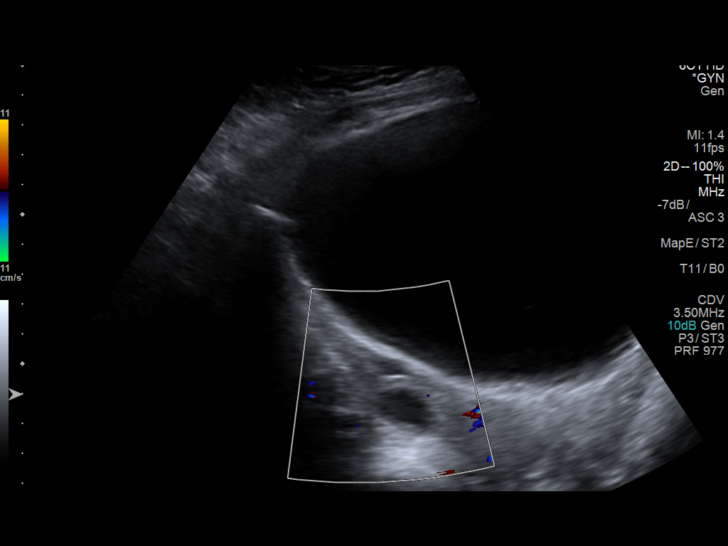
[im 14/56]
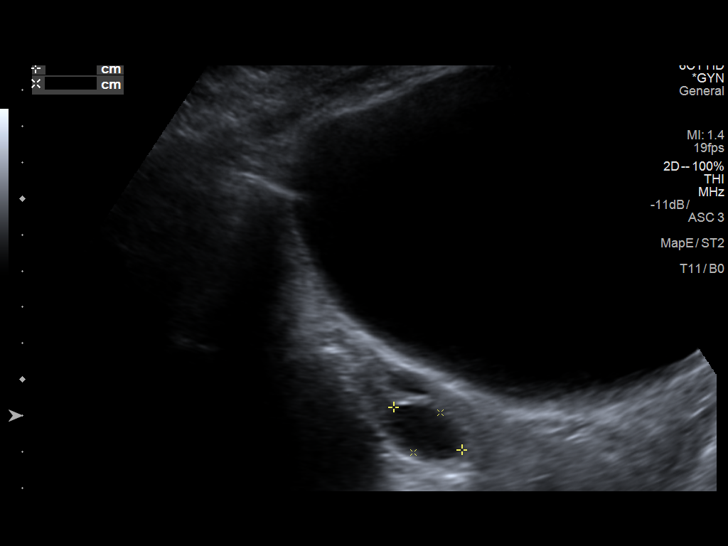
[im 19/56]
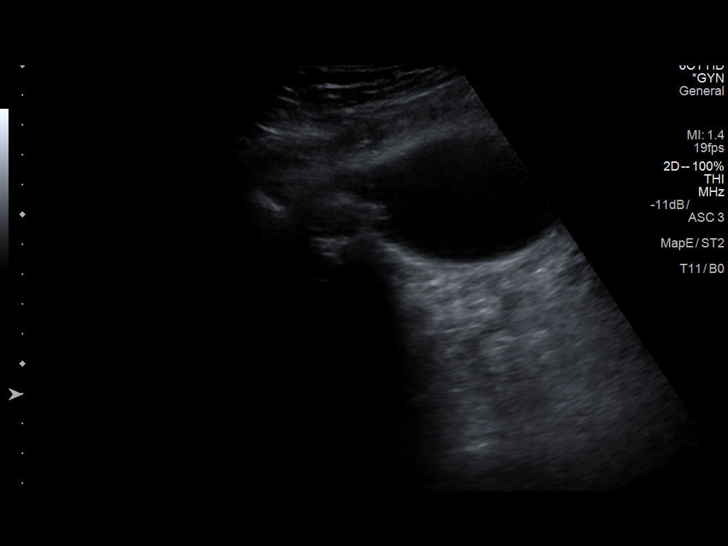
[im 21/56]
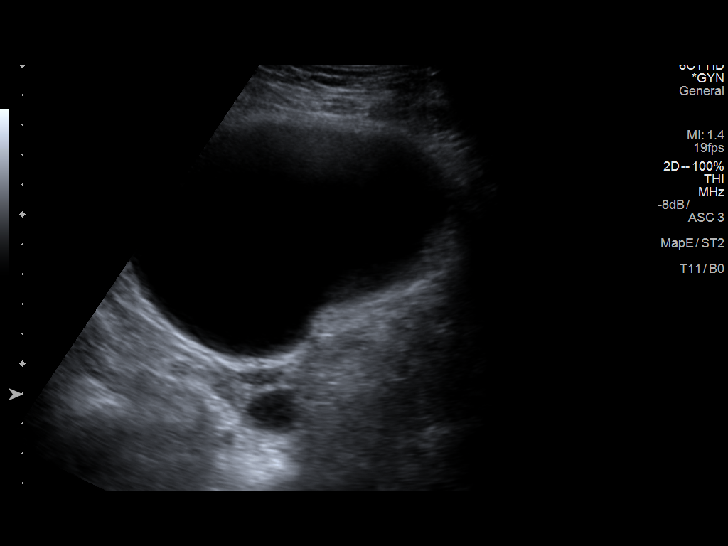
[im 26/56]
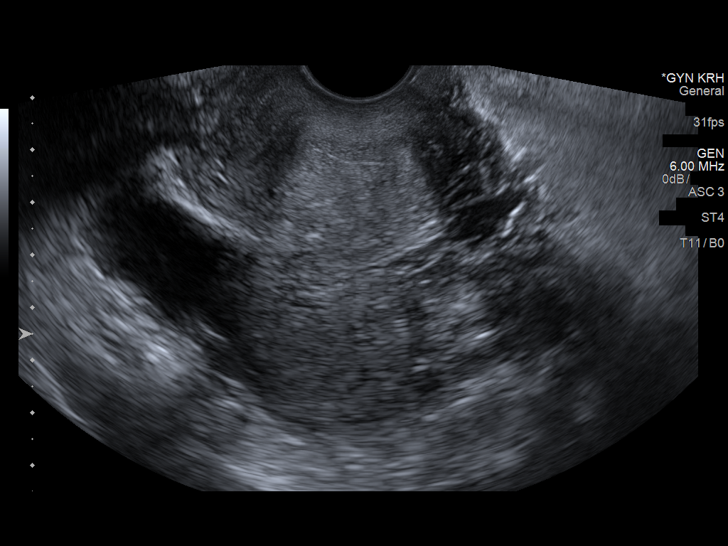
[im 30/56]
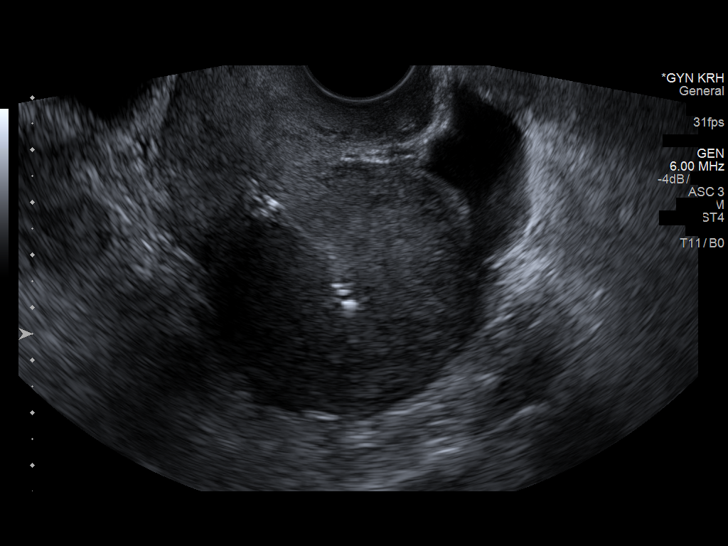
[im 35/56]
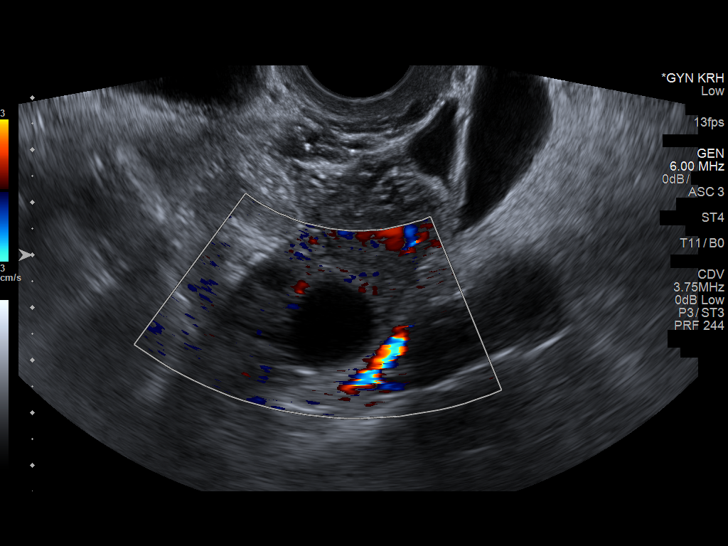
[im 37/56]
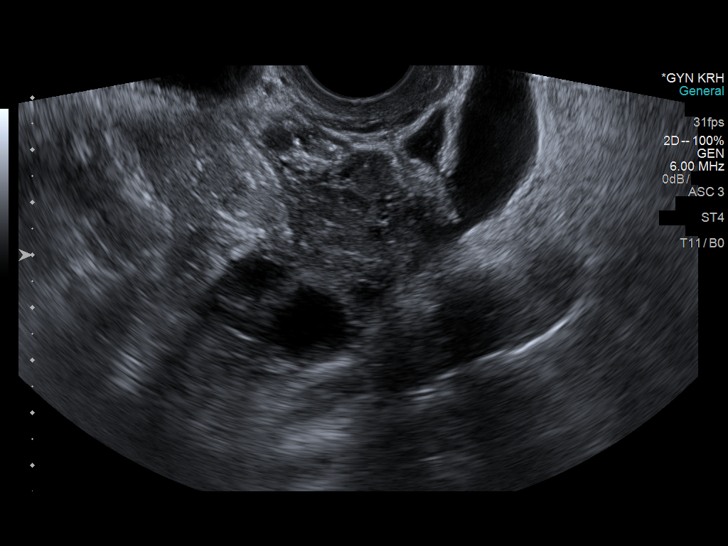
[im 42/56]
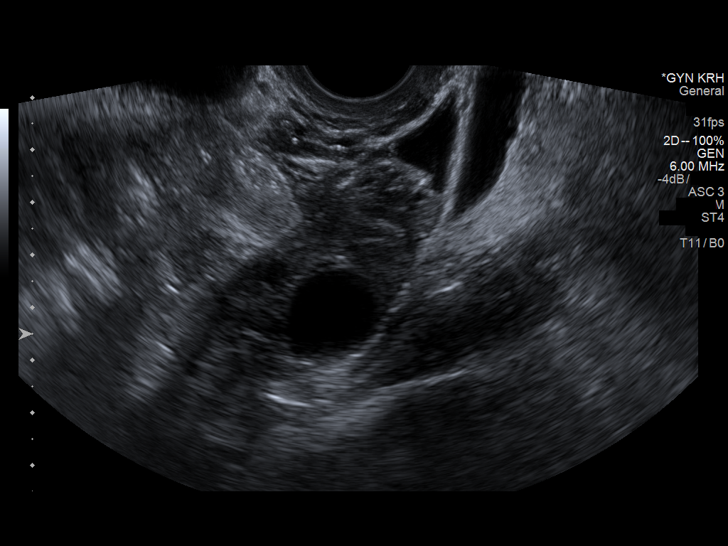
[im 46/56]
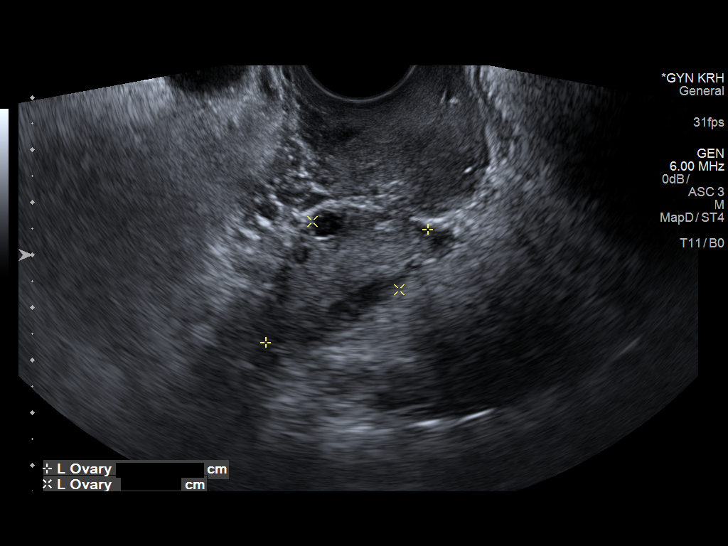
[im 51/56]
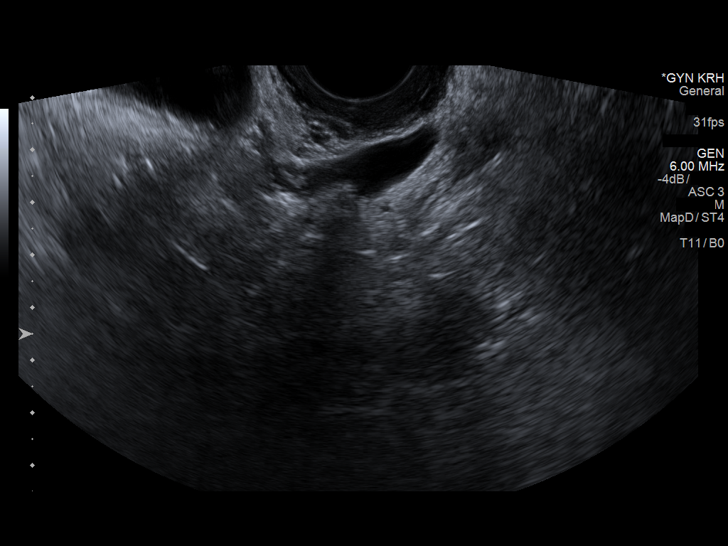
[im 56/56]
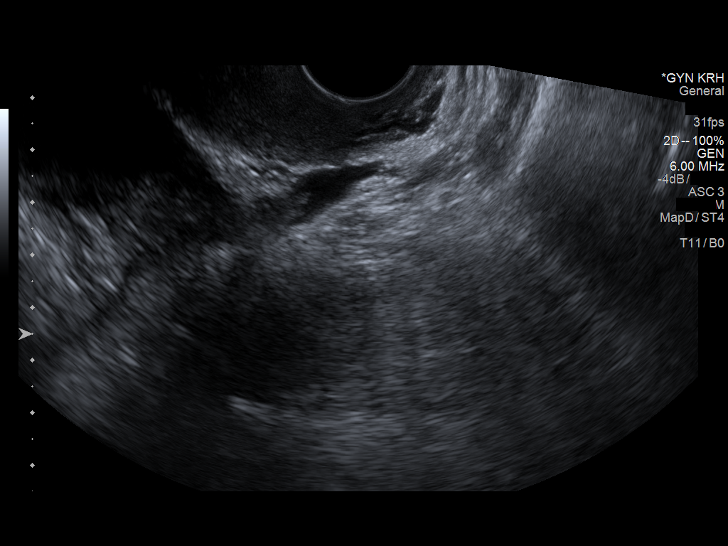

[14 of 25 positions shown; findings below may reference images not displayed]

FINDINGS: Uterus

Measurements: 8.7 x 4.6 x 6.0 cm. The uterus is retroverted. No
fibroids or other mass visualized.

Endometrium

Thickness: 4 mm. IUD seen within the fundus and body region of the
uterus.

Right ovary

Measurements: 4.0 x 2.5 x 2.8 cm. Normal appearance/no adnexal mass.
2.1 cm probable physiologic cyst noted.

Left ovary

Measurements: 3.8 x 2.1 x 2.1 cm. Normal appearance/no adnexal mass.

Other findings

Small amount of free fluid.
IMPRESSION: Retroverted uterus with IUD within the endometrial canal.

2.1 cm likely physiologic right ovarian cyst.

Small amount of free fluid in the pelvis.

## 2018-09-08 ENCOUNTER — Other Ambulatory Visit: Payer: Self-pay

## 2018-09-08 DIAGNOSIS — G47411 Narcolepsy with cataplexy: Secondary | ICD-10-CM

## 2018-09-08 MED ORDER — XYREM 500 MG/ML PO SOLN: ORAL | 5 refills | Status: DC

## 2018-09-08 NOTE — Telephone Encounter (Signed)
Received xyrem refill request from [facility]. Per Dr. Edwena Felty notes, pt is supposed to be taking xyrem 5g PO twice nightly. Will send to Dr. Brett Fairy for approval.

## 2018-09-17 ENCOUNTER — Telehealth: Payer: Self-pay | Admitting: Neurology

## 2018-09-17 NOTE — Telephone Encounter (Signed)
Pt's medication Sodium Oxybate (XYREM) is needing a PA. Please call [phone] then press option 3 then option 4 to [facility].

## 2018-09-23 ENCOUNTER — Telehealth: Payer: Self-pay | Admitting: Neurology

## 2018-09-23 NOTE — Telephone Encounter (Signed)
PA submitted.

## 2018-09-23 NOTE — Telephone Encounter (Signed)
PA completed and and submitted through [facility] and faxed information to cover my meds. Can take up to 72 hrs before hearing back.

## 2018-09-23 NOTE — Telephone Encounter (Signed)
Received a notification from [facility] that PA was cancelled because of prior approval for the patient from march 2020-march 2021.  Ref # [id number] I have created a renewal on PA and resubmitted for the patient and added to note the increase in dose. KEY [id number]

## 2018-09-29 NOTE — Telephone Encounter (Signed)
Xyrem [facility] has called to check on the status and I was able to look on cover my meds and it did indicate the patient was denied the increase of the medication. Given the history with [facility] they do not approve anything over the 4.5 g twice nightly. Dr Dohmeier allowed a verbal to change to script to return to 4.5 gm twice nightly and I will ask Dr Brett Fairy is she has another suggestion for the patient to possibly take in addition to xyrem if needed. Once I hear her recommendation then I can contact patient and see if she would like to move forward a different route since the medication was denied or just maintain on the current dose 4.5 g twice nightly and discuss at next visit.

## 2018-10-01 ENCOUNTER — Encounter: Payer: Self-pay | Admitting: Neurology

## 2018-11-24 ENCOUNTER — Encounter: Payer: Self-pay | Admitting: Adult Health

## 2018-11-24 ENCOUNTER — Other Ambulatory Visit: Payer: Self-pay

## 2018-11-24 ENCOUNTER — Ambulatory Visit: Payer: 59 | Admitting: Adult Health

## 2018-11-24 VITALS — BP 122/82 | HR 71 | Temp 97.8°F | Ht 63.75 in | Wt 136.2 lb

## 2018-11-24 DIAGNOSIS — G47419 Narcolepsy without cataplexy: Secondary | ICD-10-CM | POA: Diagnosis not present

## 2018-11-24 DIAGNOSIS — F419 Anxiety disorder, unspecified: Secondary | ICD-10-CM | POA: Diagnosis not present

## 2018-11-24 NOTE — Patient Instructions (Signed)
Your Plan:  Continue klonopin as needed Continue Zyrem If your symptoms worsen or you develop new symptoms please let us know.   Thank you for coming to see [place] at [facility]. I hope we have been able to provide you high quality care today.  You may receive a patient satisfaction survey over the next few weeks. We would appreciate your feedback and comments so that we may continue to improve ourselves and the health of our patients.

## 2018-11-24 NOTE — Progress Notes (Signed)
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  She returns today for follow-up.  She is currently on Xyrem and tolerating it well.  Her insurance would not pay for the higher dose.  She states that she has not had any cataplectic events.  She states that she has had more anxiety.  She states for a while she was not having to use Klonopin as much but he recently she has.  She states that with Covid and changes in her schedule and her kids schedule has added to her anxiety.  She denies any new symptoms.  She returns today for an evaluation.  HISTORY (Copied from Dr.Dohmeier's note) 05-20-2018 patient reports a seasonal depression, more sleepy, not necessarily fatigued. But she wondered if Xyrem effect and half life may vary from badge to badge/ lot to lot ?  She feels with her last refill there was a difference in effect. Much less sleepy making, and her appetite soared.  Now on ESS endorsing an Epworth sleepiness score of 10/ 24 points, but significant afternoon sleepiness. Will increase the dose to 5 gram bid ( nightly) from 4.5 gram - if her insurance carrier [facility] permits.   REVIEW OF SYSTEMS: Out of a complete 14 system review of symptoms, the patient complains only of the following symptoms, and all other reviewed systems are negative.  See HPI  ALLERGIES: No Known Allergies  HOME MEDICATIONS: Outpatient Medications Prior to Visit  Medication Sig Dispense Refill  . clonazePAM (KLONOPIN) 1 MG tablet Take 1/2 tab po in AM - not at bedtime. (Patient taking differently: daily as needed. Take 1/2 tab po in AM - not at bedtime.) 30 tablet 1  . lamoTRIgine (LAMICTAL) 100 MG tablet Take 100 mg by mouth daily.    . sertraline (ZOLOFT) 100 MG tablet Take 150 mg by mouth daily.   5  . Sodium Oxybate (XYREM) 500 MG/ML SOLN 5g PO twice  nightly. 600 mL 5   No facility-administered medications prior to visit.     PAST MEDICAL HISTORY: Past Medical History:  Diagnosis Date  . Anxiety   . Depression   . Excessive sleepiness   . Gallstones   . Kidney stone   . Mastitis    after breast feeding  . 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 heart disease     valve  . LITHOTRIPSY     with renal stones    FAMILY HISTORY: Family History  Problem Relation Age of Onset  . Anxiety disorder Mother   . Depression Mother   . Narcolepsy Father   . Depression Father   . Anxiety disorder Father     SOCIAL HISTORY: Social History   Socioeconomic History  . Marital status: Married    Spouse name: Joan Foley  . Number of children: 2  . Years of education: college  . Highest education level: Not on file  Occupational History  . 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 Financial resource strain: Not on file  . Food insecurity    Worry: Not on file    Inability: Not on file  . Transportation needs    Medical: Not on file    Non-medical: Not on file  Tobacco Use  . Smoking status: Former Research scientist (life sciences)  . Smokeless tobacco: Never Used  . Tobacco comment: quit smoking in 2005  Substance and Sexual Activity  . Alcohol use: Yes    Alcohol/week: 0.0 standard drinks    Comment: socially  . Drug use: No  . Sexual activity: Not on file  Lifestyle  . Physical activity    Days per week: Not on file    Minutes per session: Not on file  . Stress: Not on file  Relationships  . Social Herbalist on phone: Not on file    Gets together: Not on file    Attends religious service: Not on file    Active member of club or organization: Not on file    Attends meetings of clubs or organizations: Not on file    Relationship status: Not on file  . Intimate partner violence    Fear of current or ex  partner: Not on file    Emotionally abused: Not on file    Physically abused: Not on file    Forced sexual activity: Not on file  Other Topics Concern  . Not on file  Social History Narrative   Patient lives at home with her husband Joan Foley.)   Patient works full time.   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   Caffeine none.      PHYSICAL EXAM  Vitals:   11/24/18 1109  BP: 122/82  Pulse: 71  Temp: 97.8 F (36.6 C)  Weight: 136 lb 3.2 oz (61.8 kg)  Height: 5' 3.75" (1.619 m)   Body mass index is 23.56 kg/m.  Generalized: Well developed, in no acute distress   Neurological examination  Mentation: Alert oriented to time, place, history taking. Follows all commands speech and language fluent Cranial nerve II-XII: Pupils were equal round reactive to light. Extraocular movements were full, visual field were full on confrontational test. Head turning and shoulder shrug  were normal and symmetric. Motor: The motor testing reveals 5 over 5 strength of all 4 extremities. Good symmetric motor tone is noted throughout.  Sensory: Sensory testing is intact to soft touch on all 4 extremities. No evidence of extinction is noted.  Coordination: Cerebellar testing reveals good finger-nose-finger and heel-to-shin bilaterally.  Gait and station: Gait is normal. Reflexes: Deep tendon reflexes are symmetric and normal bilaterally.   DIAGNOSTIC DATA (LABS, IMAGING, TESTING) - I reviewed patient records, labs, notes, testing and imaging myself where available.  Lab Results  Component Value Date   WBC 5.8 05/20/2018   HGB 14.6 05/20/2018   HCT 40.8 05/20/2018   MCV 90 05/20/2018   PLT 207 05/20/2018      Component Value Date/Time   NA 141 05/20/2018 1358   K 4.7 05/20/2018 1358   CL 103 05/20/2018 1358   CO2 21 05/20/2018 1358   GLUCOSE 86 05/20/2018 1358   BUN 13 05/20/2018 1358   CREATININE 0.83 05/20/2018 1358   CALCIUM 9.4 05/20/2018 1358   PROT 7.1 05/20/2018 1358   ALBUMIN  4.8 05/20/2018 1358   AST 19 05/20/2018 1358   ALT 16 05/20/2018 1358   ALKPHOS 70 05/20/2018 1358   BILITOT 0.4 05/20/2018 1358   GFRNONAA 87 05/20/2018 1358   GFRAA 101 05/20/2018 1358    Lab Results  Component Value Date   TSH 1.390 05/20/2018      ASSESSMENT AND PLAN 43 y.o. 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 here with:  1.  Narcolepsy without cataplexy 2.  Anxiety  Overall the patient has done well.  She will continue on Xyrem and Klonopin.  I have advised that if her symptoms worsen or she develops new symptoms she should let us know.  She will follow-up in 6 months or sooner if needed.      Butch PennyMegan Janee Ureste, MSN, NP-C 11/24/2018, 11:45 AM [facility] [address], [address] [place], [place] [id number] [phone]

## 2018-12-08 ENCOUNTER — Encounter: Payer: Self-pay | Admitting: Neurology

## 2019-01-25 ENCOUNTER — Encounter: Payer: Self-pay | Admitting: Neurology

## 2019-01-25 NOTE — Telephone Encounter (Signed)
I like to consider NP Lance Coon, or Dr Nelly Rout, MD

## 2019-02-23 ENCOUNTER — Other Ambulatory Visit: Payer: Self-pay | Admitting: Neurology

## 2019-02-23 DIAGNOSIS — G47411 Narcolepsy with cataplexy: Secondary | ICD-10-CM

## 2019-02-23 MED ORDER — XYREM 500 MG/ML PO SOLN: ORAL | 5 refills | Status: DC

## 2019-03-12 ENCOUNTER — Telehealth: Payer: Self-pay | Admitting: Neurology

## 2019-03-12 NOTE — Telephone Encounter (Signed)
Completed PA through cover my meds/ [employer].  [id number] Will wait for their response.

## 2019-03-16 NOTE — Telephone Encounter (Signed)
PA approved for the patient through [employer] Approved until 03/11/2020

## 2019-05-25 ENCOUNTER — Other Ambulatory Visit: Payer: Self-pay

## 2019-05-25 ENCOUNTER — Encounter: Payer: Self-pay | Admitting: Adult Health

## 2019-05-25 ENCOUNTER — Ambulatory Visit: Payer: 59 | Admitting: Adult Health

## 2019-05-25 VITALS — BP 119/65 | HR 53 | Ht 63.0 in | Wt 136.0 lb

## 2019-05-25 DIAGNOSIS — Z0000 Encounter for general adult medical examination without abnormal findings: Secondary | ICD-10-CM | POA: Diagnosis not present

## 2019-05-25 DIAGNOSIS — Z79899 Other long term (current) drug therapy: Secondary | ICD-10-CM | POA: Diagnosis not present

## 2019-05-25 DIAGNOSIS — G47419 Narcolepsy without cataplexy: Secondary | ICD-10-CM | POA: Diagnosis not present

## 2019-05-25 NOTE — Patient Instructions (Addendum)
Your Plan:  Continue Xyrem and Klonopin If your symptoms worsen or you develop new symptoms please let us know.    Thank you for coming to see [place] at [facility]. I hope we have been able to provide you high quality care today.  You may receive a patient satisfaction survey over the next few weeks. We would appreciate your feedback and comments so that we may continue to improve ourselves and the health of our patients.

## 2019-05-25 NOTE — Progress Notes (Signed)
PATIENT: Joan Foley DOB: 04-13-1975  REASON FOR VISIT: follow up HISTORY FROM: patient  HISTORY OF PRESENT ILLNESS: Today 05/25/19:   Joan Foley is a 44 year old female with a history of narcolepsy.  She returns today for follow-up.  She remains on Xyrem and tolerates it well.  Denies any cataplectic events.  Reports that she is not having to use Klonopin as often for anxiety.  Overall she feels that she is doing well.  Returns today for an evaluation.  HISTORY 11/24/18:  Joan Foley is a 44 year old female with a history of narcolepsy.  She returns today for follow-up.  She is currently on Xyrem and tolerating it well.  Her insurance would not pay for the higher dose.  She states that she has not had any cataplectic events.  She states that she has had more anxiety.  She states for a while she was not having to use Klonopin as much but he recently she has.  She states that with Covid and changes in her schedule and her kids schedule has added to her anxiety.  She denies any new symptoms.  She returns today for an evaluation.   REVIEW OF SYSTEMS: Out of a complete 14 system review of symptoms, the patient complains only of the following symptoms, and all other reviewed systems are negative.  See HPI  ALLERGIES: No Known Allergies  HOME MEDICATIONS: Outpatient Medications Prior to Visit  Medication Sig Dispense Refill  . clonazePAM (KLONOPIN) 1 MG tablet Take 1/2 tab po in AM - not at bedtime. (Patient taking differently: daily as needed. Take 1/2 tab po in AM - not at bedtime.) 30 tablet 1  . lamoTRIgine (LAMICTAL) 100 MG tablet Take 100 mg by mouth daily.    . sertraline (ZOLOFT) 100 MG tablet Take 150 mg by mouth daily.   5  . Sodium Oxybate (XYREM) 500 MG/ML SOLN 4.5g PO twice nightly. 540 mL 5   No facility-administered medications prior to visit.    PAST MEDICAL HISTORY: Past Medical History:  Diagnosis Date  . Anxiety   . Depression   . Excessive  sleepiness   . Gallstones   . Kidney stone   . Mastitis    after breast feeding  . 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 heart disease     valve  . LITHOTRIPSY     with renal stones    FAMILY HISTORY: Family History  Problem Relation Age of Onset  . Anxiety disorder Mother   . Depression Mother   . Narcolepsy Father   . Depression Father   . Anxiety disorder Father     SOCIAL HISTORY: Social History   Socioeconomic History  . Marital status: Married    Spouse name: scott  . Number of children: 2  . Years of education: college  . Highest education level: Not on file  Occupational History  . 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 Smoking status: Former Games developer  . Smokeless tobacco: Never Used  . Tobacco comment: quit smoking in 2005  Substance and Sexual Activity  . Alcohol use: Yes    Alcohol/week: 0.0 standard drinks    Comment: socially  . Drug use: No  . Sexual activity: Not on file  Other Topics Concern  . Not on file  Social History Narrative   Patient lives at home with her  husband Acupuncturist.)   Patient works full time.   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   Caffeine none.   Social Determinants of Health   Financial Resource Strain:   . Difficulty of Paying Living Expenses:   Food Insecurity:   . Worried About Charity fundraiser in the Last Year:   . Arboriculturist in the Last Year:   Transportation Needs:   . Film/video editor (Medical):   Marland Kitchen Lack of Transportation (Non-Medical):   Physical Activity:   . Days of Exercise per Week:   . Minutes of Exercise per Session:   Stress:   . Feeling of Stress :   Social Connections:   . Frequency of Communication with Friends and Family:   . Frequency of Social Gatherings with Friends and Family:   . Attends Religious Services:   . Active Member of Clubs or  Organizations:   . Attends Archivist Meetings:   Marland Kitchen Marital Status:   Intimate Partner Violence:   . Fear of Current or Ex-Partner:   . Emotionally Abused:   Marland Kitchen Physically Abused:   . Sexually Abused:       PHYSICAL EXAM  Vitals:   05/25/19 1047  BP: 119/65  Pulse: (!) 53  Weight: 136 lb (61.7 kg)  Height: 5\' 3"  (1.6 m)   Body mass index is 24.09 kg/m.  Generalized: Well developed, in no acute distress   Neurological examination  Mentation: Alert oriented to time, place, history taking. Follows all commands speech and language fluent Cranial nerve II-XII: Pupils were equal round reactive to light. Extraocular movements were full, visual field were full on confrontational test.Head turning and shoulder shrug  were normal and symmetric. Motor: The motor testing reveals 5 over 5 strength of all 4 extremities. Good symmetric motor tone is noted throughout.  Sensory: Sensory testing is intact to soft touch on all 4 extremities. No evidence of extinction is noted.  Coordination: Cerebellar testing reveals good finger-nose-finger and heel-to-shin bilaterally.  Gait and station: Gait is normal.  Reflexes: Deep tendon reflexes are symmetric and normal bilaterally.   DIAGNOSTIC DATA (LABS, IMAGING, TESTING) - I reviewed patient records, labs, notes, testing and imaging myself where available.  Lab Results  Component Value Date   WBC 5.8 05/20/2018   HGB 14.6 05/20/2018   HCT 40.8 05/20/2018   MCV 90 05/20/2018   PLT 207 05/20/2018      Component Value Date/Time   NA 141 05/20/2018 1358   K 4.7 05/20/2018 1358   CL 103 05/20/2018 1358   CO2 21 05/20/2018 1358   GLUCOSE 86 05/20/2018 1358   BUN 13 05/20/2018 1358   CREATININE 0.83 05/20/2018 1358   CALCIUM 9.4 05/20/2018 1358   PROT 7.1 05/20/2018 1358   ALBUMIN 4.8 05/20/2018 1358   AST 19 05/20/2018 1358   ALT 16 05/20/2018 1358   ALKPHOS 70 05/20/2018 1358   BILITOT 0.4 05/20/2018 1358   GFRNONAA 87  05/20/2018 1358   GFRAA 101 05/20/2018 1358       ASSESSMENT AND PLAN 44 y.o. 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 here with:    Narcolepsy   Continue Xyrem  Blood work today-- CMP, will also do a lipid panel per patient request- Suppose to go to OB to have blood work- this will prevent another blood draw.  She is fasting and I  will forward the results to her OB.  Anxiety  Continue Klonopin as needed for anxiety   I spent 20 minutes of face-to-face and non-face-to-face time with patient.  This included previsit chart review, lab review, study review, order entry, electronic health record documentation, patient education.  Butch Penny, MSN, NP-C 05/25/2019, 11:01 AM [facility] [address] [place], [place] [address] [phone]

## 2019-05-26 LAB — COMPREHENSIVE METABOLIC PANEL
ALT: 9 IU/L (ref 0–32)
AST: 15 IU/L (ref 0–40)
Albumin/Globulin Ratio: 2.6 — ABNORMAL HIGH (ref 1.2–2.2)
Albumin: 4.6 g/dL (ref 3.8–4.8)
Alkaline Phosphatase: 55 IU/L (ref 48–121)
BUN/Creatinine Ratio: 18 (ref 9–23)
BUN: 15 mg/dL (ref 6–24)
Bilirubin Total: 0.5 mg/dL (ref 0.0–1.2)
CO2: 21 mmol/L (ref 20–29)
Calcium: 9.3 mg/dL (ref 8.7–10.2)
Chloride: 104 mmol/L (ref 96–106)
Creatinine, Ser: 0.82 mg/dL (ref 0.57–1.00)
GFR calc Af Amer: 101 mL/min/{1.73_m2} (ref >59)
GFR calc non Af Amer: 88 mL/min/{1.73_m2} (ref >59)
Globulin, Total: 1.8 g/dL (ref 1.5–4.5)
Glucose: 83 mg/dL (ref 65–99)
Potassium: 4 mmol/L (ref 3.5–5.2)
Sodium: 139 mmol/L (ref 134–144)
Total Protein: 6.4 g/dL (ref 6.0–8.5)

## 2019-05-26 LAB — LIPID PANEL
Chol/HDL Ratio: 2.1 ratio (ref 0.0–4.4)
Cholesterol, Total: 185 mg/dL (ref 100–199)
HDL: 87 mg/dL (ref >39)
LDL Chol Calc (NIH): 85 mg/dL (ref 0–99)
Triglycerides: 71 mg/dL (ref 0–149)
VLDL Cholesterol Cal: 13 mg/dL (ref 5–40)

## 2019-05-27 ENCOUNTER — Telehealth: Payer: Self-pay

## 2019-05-27 NOTE — Telephone Encounter (Signed)
Joan Foley is a 44 y.o. female was contacted and notified of her labs results being unremarkable.

## 2019-05-27 NOTE — Telephone Encounter (Signed)
-----   Message from Butch Penny, NP sent at 05/26/2019  9:03 AM EDT ----- Labs results are unremarkable. Please call patient with results.  Please send cholesterol panel to OB/GYN

## 2019-08-02 ENCOUNTER — Other Ambulatory Visit: Payer: Self-pay | Admitting: Neurology

## 2019-08-02 DIAGNOSIS — G47411 Narcolepsy with cataplexy: Secondary | ICD-10-CM

## 2019-08-02 MED ORDER — XYREM 500 MG/ML PO SOLN: ORAL | 5 refills | Status: DC

## 2019-11-28 NOTE — Patient Instructions (Addendum)
Your Plan:  Continue xyrem Continue Klonopin as needed for anxiety If your symptoms worsen or you develop new symptoms please let us know.    Thank you for coming to see [place] at [facility]. I hope we have been able to provide you high quality care today.  You may receive a patient satisfaction survey over the next few weeks. We would appreciate your feedback and comments so that we may continue to improve ourselves and the health of our patients.

## 2019-11-28 NOTE — Progress Notes (Signed)
PATIENT: Joan Foley DOB: November 14, 1975  REASON FOR VISIT: follow up HISTORY FROM: patient  HISTORY OF PRESENT ILLNESS: Today 11/28/19:  Joan Foley is a 44 year old female with a history of narcolepsy. She returns today for follow-up. She remains on Xyrem. She does have Klonopin that she uses for anxiety-this is currently being prescribed by her psychiatrist.  She denies any significant changes in her depression.  No thoughts of harming herself or others.  She returns today for an evaluation.  05/25/19: Joan Foley is a 44 year old female with a history of narcolepsy.  She returns today for follow-up.  She remains on Xyrem and tolerates it well.  Denies any cataplectic events.  Reports that she is not having to use Klonopin as often for anxiety.  Overall she feels that she is doing well.  Returns today for an evaluation.  HISTORY 11/24/18:  Joan Foley is a 44 year old female with a history of narcolepsy.  She returns today for follow-up.  She is currently on Xyrem and tolerating it well.  Her insurance would not pay for the higher dose.  She states that she has not had any cataplectic events.  She states that she has had more anxiety.  She states for a while she was not having to use Klonopin as much but he recently she has.  She states that with Covid and changes in her schedule and her kids schedule has added to her anxiety.  She denies any new symptoms.  She returns today for an evaluation.   REVIEW OF SYSTEMS: Out of a complete 14 system review of symptoms, the patient complains only of the following symptoms, and all other reviewed systems are negative.  See HPI  ALLERGIES: No Known Allergies  HOME MEDICATIONS: Outpatient Medications Prior to Visit  Medication Sig Dispense Refill  . clonazePAM (KLONOPIN) 1 MG tablet Take 1/2 tab po in AM - not at bedtime. (Patient taking differently: daily as needed. Take 1/2 tab po in AM - not at bedtime.) 30 tablet 1  . lamoTRIgine  (LAMICTAL) 100 MG tablet Take 100 mg by mouth daily.    . sertraline (ZOLOFT) 100 MG tablet Take 150 mg by mouth daily.   5  . Sodium Oxybate (XYREM) 500 MG/ML SOLN 4.5g PO twice nightly. 540 mL 5   No facility-administered medications prior to visit.    PAST MEDICAL HISTORY: Past Medical History:  Diagnosis Date  . Anxiety   . Depression   . Excessive sleepiness   . Gallstones   . Kidney stone   . Mastitis    after breast feeding  . 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 heart disease     valve  . LITHOTRIPSY     with renal stones    FAMILY HISTORY: Family History  Problem Relation Age of Onset  . Anxiety disorder Mother   . Depression Mother   . Narcolepsy Father   . Depression Father   . Anxiety disorder Father     SOCIAL HISTORY: Social History   Socioeconomic History  . Marital status: Married    Spouse name: scott  . Number of children: 2  . Years of education: college  . Highest education level: Not on file  Occupational History  . 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 Smoking status: Former Games developer  . Smokeless tobacco: Never Used  .  Tobacco comment: quit smoking in 2005  Substance and Sexual Activity  . Alcohol use: Yes    Alcohol/week: 0.0 standard drinks    Comment: socially  . Drug use: No  . Sexual activity: Not on file  Other Topics Concern  . Not on file  Social History Narrative   Patient lives at home with her husband Joan Foley.)   Patient works full time.   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   Caffeine none.   Social Determinants of Health   Financial Resource Strain:   . Difficulty of Paying Living Expenses: Not on file  Food Insecurity:   . Worried About Programme researcher, broadcasting/film/video in the Last Year: Not on file  . Ran Out of Food in the Last Year: Not on file  Transportation Needs:   . Lack of  Transportation (Medical): Not on file  . Lack of Transportation (Non-Medical): Not on file  Physical Activity:   . Days of Exercise per Week: Not on file  . Minutes of Exercise per Session: Not on file  Stress:   . Feeling of Stress : Not on file  Social Connections:   . Frequency of Communication with Friends and Family: Not on file  . Frequency of Social Gatherings with Friends and Family: Not on file  . Attends Religious Services: Not on file  . Active Member of Clubs or Organizations: Not on file  . Attends Banker Meetings: Not on file  . Marital Status: Not on file  Intimate Partner Violence:   . Fear of Current or Ex-Partner: Not on file  . Emotionally Abused: Not on file  . Physically Abused: Not on file  . Sexually Abused: Not on file      PHYSICAL EXAM  There were no vitals filed for this visit. There is no height or weight on file to calculate BMI.  Generalized: Well developed, in no acute distress   Neurological examination  Mentation: Alert oriented to time, place, history taking. Follows all commands speech and language fluent Cranial nerve II-XII: Pupils were equal round reactive to light. Extraocular movements were full, visual field were full on confrontational test.Head turning and shoulder shrug  were normal and symmetric. Motor: The motor testing reveals 5 over 5 strength of all 4 extremities. Good symmetric motor tone is noted throughout.  Sensory: Sensory testing is intact to soft touch on all 4 extremities. No evidence of extinction is noted.  Coordination: Cerebellar testing reveals good finger-nose-finger and heel-to-shin bilaterally.  Gait and station: Gait is normal.  Reflexes: Deep tendon reflexes are symmetric and normal bilaterally.   DIAGNOSTIC DATA (LABS, IMAGING, TESTING) - I reviewed patient records, labs, notes, testing and imaging myself where available.  Lab Results  Component Value Date   WBC 5.8 05/20/2018   HGB 14.6  05/20/2018   HCT 40.8 05/20/2018   MCV 90 05/20/2018   PLT 207 05/20/2018      Component Value Date/Time   NA 139 05/25/2019 1117   K 4.0 05/25/2019 1117   CL 104 05/25/2019 1117   CO2 21 05/25/2019 1117   GLUCOSE 83 05/25/2019 1117   BUN 15 05/25/2019 1117   CREATININE 0.82 05/25/2019 1117   CALCIUM 9.3 05/25/2019 1117   PROT 6.4 05/25/2019 1117   ALBUMIN 4.6 05/25/2019 1117   AST 15 05/25/2019 1117   ALT 9 05/25/2019 1117   ALKPHOS 55 05/25/2019 1117   BILITOT 0.5 05/25/2019 1117   GFRNONAA 88 05/25/2019 1117  GFRAA 101 05/25/2019 1117       ASSESSMENT AND PLAN 44 y.o. 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 here with:    Narcolepsy   Continue Xyrem  Blood work today in May was unremarkable--> Na 139  Anxiety  Continue Klonopin as needed for anxiety-currently managed by her psychiatrist  Follow-up in 6 months or sooner if needed   I spent 20 minutes of face-to-face and non-face-to-face time with patient.  This included previsit chart review, lab review, study review, order entry, electronic health record documentation, patient education.  Butch Penny, MSN, NP-C 11/28/2019, 8:49 AM [facility] [address] [place], [place] [address] [phone]

## 2019-11-29 ENCOUNTER — Encounter: Payer: Self-pay | Admitting: Adult Health

## 2019-11-29 ENCOUNTER — Ambulatory Visit (INDEPENDENT_AMBULATORY_CARE_PROVIDER_SITE_OTHER): Payer: 59 | Admitting: Adult Health

## 2019-11-29 VITALS — BP 122/74 | Ht 63.0 in | Wt 142.0 lb

## 2019-11-29 DIAGNOSIS — G47419 Narcolepsy without cataplexy: Secondary | ICD-10-CM

## 2019-11-29 DIAGNOSIS — F419 Anxiety disorder, unspecified: Secondary | ICD-10-CM | POA: Diagnosis not present

## 2020-01-06 ENCOUNTER — Other Ambulatory Visit: Payer: Self-pay | Admitting: Neurology

## 2020-01-06 DIAGNOSIS — G47411 Narcolepsy with cataplexy: Secondary | ICD-10-CM

## 2020-01-06 MED ORDER — XYREM 500 MG/ML PO SOLN: ORAL | 5 refills | Status: DC

## 2020-02-14 ENCOUNTER — Telehealth: Payer: Self-pay | Admitting: Neurology

## 2020-02-14 NOTE — Telephone Encounter (Signed)
PA completed on cover my meds/[employer] rx [id number] Can take up to 72 hours before hearing a response

## 2020-03-02 NOTE — Telephone Encounter (Signed)
Received a notification that the PA was approved already through [employer]. [id number] 02/14/2020-02/13/2021

## 2020-03-02 NOTE — Telephone Encounter (Signed)
Never received a response from insurance on this PA I had to restart a new one. PA restarted for the patient through cover my medds/optumrx KEY: [id number] Will wait for response

## 2020-06-07 ENCOUNTER — Other Ambulatory Visit: Payer: Self-pay

## 2020-06-07 ENCOUNTER — Ambulatory Visit: Payer: 59 | Admitting: Adult Health

## 2020-06-07 ENCOUNTER — Encounter: Payer: Self-pay | Admitting: Adult Health

## 2020-06-07 VITALS — BP 115/68 | HR 60 | Ht 63.0 in | Wt 149.0 lb

## 2020-06-07 DIAGNOSIS — G47419 Narcolepsy without cataplexy: Secondary | ICD-10-CM | POA: Diagnosis not present

## 2020-06-07 NOTE — Patient Instructions (Addendum)
Your Plan:  Continue xyrem Blood work today If your symptoms worsen or you develop new symptoms please let us know.   Thank you for coming to see [place] at [facility]. I hope we have been able to provide you high quality care today.  You may receive a patient satisfaction survey over the next few weeks. We would appreciate your feedback and comments so that we may continue to improve ourselves and the health of our patients.

## 2020-06-07 NOTE — Progress Notes (Signed)
PATIENT: Joan Foley DOB: 05-16-1975  REASON FOR VISIT: follow up HISTORY FROM: patient  HISTORY OF PRESENT ILLNESS: Today 06/07/20:  Ms. 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  She remains on Xyrem 4.5 g twice nightly.  She reports that this continues to work well for her.  She denies falling asleep while working or driving.  Denies any thoughts of harming herself or others.  Feels that her depression is well controlled.  She continues to take Klonopin prescribed by her psychiatrist.  11/29/19: Ms. 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 She returns today for follow-up. She remains on Xyrem. She does have Klonopin that she uses for anxiety-this is currently being prescribed by her psychiatrist.  She denies any significant changes in her depression.  No thoughts of harming herself or others.  She returns today for an evaluation.  05/25/19: Ms. 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  She returns today for follow-up.  She remains on Xyrem and tolerates it well.  Denies any cataplectic events.  Reports that she is not having to use Klonopin as often for anxiety.  Overall she feels that she is doing well.  Returns today for an evaluation.  HISTORY 11/24/18:  Ms. 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  She returns today for follow-up.  She is currently on Xyrem and tolerating it well.  Her insurance would not pay for the higher dose.  She states that she has not had any cataplectic events.  She states that she has had more anxiety.  She states for a while she was not having to use Klonopin as much but he recently she has.  She states that with Covid and changes in her schedule and her kids schedule has added to her anxiety.  She denies any new symptoms.  She returns today for an evaluation.   REVIEW OF SYSTEMS: Out of a complete 14 system review of symptoms, the patient complains only of the  following symptoms, and all other reviewed systems are negative.  See HPI  ALLERGIES: No Known Allergies  HOME MEDICATIONS: Outpatient Medications Prior to Visit  Medication Sig Dispense Refill  . clonazePAM (KLONOPIN) 1 MG tablet Take 1/2 tab po in AM - not at bedtime. (Patient taking differently: daily as needed. Take 1/2 tab po in AM - not at bedtime.) 30 tablet 1  . lamoTRIgine (LAMICTAL) 100 MG tablet Take 100 mg by mouth daily.    . sertraline (ZOLOFT) 100 MG tablet Take 150 mg by mouth daily.   5  . Sodium Oxybate (XYREM) 500 MG/ML SOLN 4.5g PO twice nightly. 540 mL 5   No facility-administered medications prior to visit.    PAST MEDICAL HISTORY: Past Medical History:  Diagnosis Date  . Anxiety   . Depression   . Excessive sleepiness   . Gallstones   . Kidney stone   . Mastitis    after breast feeding  . 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 heart disease     valve  . LITHOTRIPSY     with renal stones    FAMILY HISTORY: Family History  Problem Relation Age of Onset  . Anxiety disorder Mother   . Depression Mother   . Narcolepsy Father   . Depression Father   . Anxiety disorder  Father     SOCIAL HISTORY: Social History   Socioeconomic History  . Marital status: Married    Spouse name: scott  . Number of children: 2  . Years of education: college  . Highest education level: Not on file  Occupational History  . 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 Smoking status: Former Games developer  . Smokeless tobacco: Never Used  . Tobacco comment: quit smoking in 2005  Substance and Sexual Activity  . Alcohol use: Yes    Alcohol/week: 0.0 standard drinks    Comment: socially  . Drug use: No  . Sexual activity: Not on file  Other Topics Concern  . Not on file  Social History Narrative   Patient lives at home with her husband  [name].)   Patient works full time.   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   Caffeine none.   Social Determinants of Health   Financial Resource Strain: Not on file  Food Insecurity: Not on file  Transportation Needs: Not on file  Physical Activity: Not on file  Stress: Not on file  Social Connections: Not on file  Intimate Partner Violence: Not on file      PHYSICAL EXAM  Vitals:   06/07/20 1122  BP: 115/68  Pulse: 60  Weight: 149 lb (67.6 kg)  Height: 5\' 3"  (1.6 m)   Body mass index is 26.39 kg/m.  Generalized: Well developed, in no acute distress   Neurological examination  Mentation: Alert oriented to time, place, history taking. Follows all commands speech and language fluent Cranial nerve II-XII: Pupils were equal round reactive to light. Extraocular movements were full, visual field were full on confrontational test.Head turning and shoulder shrug  were normal and symmetric. Motor: The motor testing reveals 5 over 5 strength of all 4 extremities. Good symmetric motor tone is noted throughout.  Sensory: Sensory testing is intact to soft touch on all 4 extremities. No evidence of extinction is noted.  Coordination: Cerebellar testing reveals good finger-nose-finger and heel-to-shin bilaterally.  Gait and station: Gait is normal.  Reflexes: Deep tendon reflexes are symmetric and normal bilaterally.   DIAGNOSTIC DATA (LABS, IMAGING, TESTING) - I reviewed patient records, labs, notes, testing and imaging myself where available.  Lab Results  Component Value Date   WBC 5.8 05/20/2018   HGB 14.6 05/20/2018   HCT 40.8 05/20/2018   MCV 90 05/20/2018   PLT 207 05/20/2018      Component Value Date/Time   NA 139 05/25/2019 1117   K 4.0 05/25/2019 1117   CL 104 05/25/2019 1117   CO2 21 05/25/2019 1117   GLUCOSE 83 05/25/2019 1117   BUN 15 05/25/2019 1117   CREATININE 0.82 05/25/2019 1117   CALCIUM 9.3 05/25/2019 1117   PROT 6.4 05/25/2019 1117   ALBUMIN 4.6  05/25/2019 1117   AST 15 05/25/2019 1117   ALT 9 05/25/2019 1117   ALKPHOS 55 05/25/2019 1117   BILITOT 0.5 05/25/2019 1117   GFRNONAA 88 05/25/2019 1117   GFRAA 101 05/25/2019 1117       ASSESSMENT AND PLAN 45 y.o. 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 here with:    Narcolepsy   Continue Xyrem 4.5 g twice nightly  Blood work today  Anxiety  Continue Klonopin as needed for anxiety-currently managed by her psychiatrist  Follow-up in 6 months or sooner if needed  Butch Penny, MSN, NP-C 06/07/2020, 11:24 AM [facility] [address], [address] [place], [place] [address] [phone]

## 2020-06-08 ENCOUNTER — Telehealth: Payer: Self-pay

## 2020-06-08 LAB — COMPREHENSIVE METABOLIC PANEL
ALT: 13 IU/L (ref 0–32)
AST: 13 IU/L (ref 0–40)
Albumin/Globulin Ratio: 2.1 (ref 1.2–2.2)
Albumin: 4.6 g/dL (ref 3.8–4.8)
Alkaline Phosphatase: 59 IU/L (ref 44–121)
BUN/Creatinine Ratio: 20 (ref 9–23)
BUN: 16 mg/dL (ref 6–24)
Bilirubin Total: 0.3 mg/dL (ref 0.0–1.2)
CO2: 23 mmol/L (ref 20–29)
Calcium: 9.4 mg/dL (ref 8.7–10.2)
Chloride: 103 mmol/L (ref 96–106)
Creatinine, Ser: 0.79 mg/dL (ref 0.57–1.00)
Globulin, Total: 2.2 g/dL (ref 1.5–4.5)
Glucose: 88 mg/dL (ref 65–99)
Potassium: 4.4 mmol/L (ref 3.5–5.2)
Sodium: 146 mmol/L — ABNORMAL HIGH (ref 134–144)
Total Protein: 6.8 g/dL (ref 6.0–8.5)
eGFR: 95 mL/min/{1.73_m2} (ref >59)

## 2020-06-08 NOTE — Telephone Encounter (Signed)
Sent pt a message via MyChart stating  Hi Joan Foley, I wanted to let you know that your sodium level is just slightly elevated.  We will recheck in 3 months. If you need anything in the meantime, please let us know. Have a great day!

## 2020-06-08 NOTE — Telephone Encounter (Signed)
-----   Message from Bobbye Morton, CMA sent at 06/08/2020 11:16 AM EDT ----- I will contact pt

## 2020-06-20 ENCOUNTER — Other Ambulatory Visit: Payer: Self-pay | Admitting: Neurology

## 2020-06-20 DIAGNOSIS — G47411 Narcolepsy with cataplexy: Secondary | ICD-10-CM

## 2020-06-20 MED ORDER — XYREM 500 MG/ML PO SOLN: ORAL | 5 refills | Status: DC

## 2020-09-25 ENCOUNTER — Other Ambulatory Visit: Payer: Self-pay

## 2020-09-25 ENCOUNTER — Ambulatory Visit
Admission: RE | Admit: 2020-09-25 | Discharge: 2020-09-25 | Disposition: A | Discharge status: Home / Self Care | Payer: 59 | Source: Ambulatory Visit | Attending: Emergency Medicine | Admitting: Emergency Medicine

## 2020-09-25 VITALS — BP 108/75 | HR 104 | Temp 98.0°F | Resp 18

## 2020-09-25 DIAGNOSIS — I4729 Other ventricular tachycardia: Secondary | ICD-10-CM

## 2020-09-25 DIAGNOSIS — R197 Diarrhea, unspecified: Secondary | ICD-10-CM

## 2020-09-25 DIAGNOSIS — I472 Ventricular tachycardia: Secondary | ICD-10-CM | POA: Diagnosis not present

## 2020-09-25 MED ORDER — DICYCLOMINE HCL 10 MG PO CAPS: 10.0000 mg | ORAL_CAPSULE | Freq: Two times a day (BID) | ORAL | 0 refills | Status: DC | PRN

## 2020-09-25 NOTE — ED Triage Notes (Signed)
Pt report diarrhea that started last wednesday, body aches, elevated temperature. Pt reports feeling nauseous.

## 2020-09-25 NOTE — Discharge Instructions (Addendum)
Continue to monitor your diarrhea symptoms at home.  It is my opinion that your diarrhea is most likely related to a norovirus or some other common gastroenteritis.  As we discussed, replace water with an electrolyte fluid such as Gatorade, Powerade, Pedialyte, approximately 6 to 8 ounces per loose stool.  Please see the handout regarding food choices for relieving diarrhea to help you decide what to begin eating and hopefully get your appetite back.  As we discussed, your EKG was not completely normal.  I do not believe it is concerning for any heart disease or injury to your heart however there is an abnormal finding that could potentially be concerning for a pulmonary embolism which is a blood clot that travels from a distant part of your body into the lungs and blocks your lungs ability to absorb oxygen.  At this time, I do not feel that any of the symptoms that you are having support this finding on her EKG.  However, she is due to begin to experience acute shortness of breath, pain in your chest, altered mental status is important that you call 911 immediately.  Please discuss this with your family members.  Once the results of your blood work is received today you will be contacted with those results.  If they are abnormal I will contact you myself this evening and recommend treatment as needed.  Otherwise, please continue conservative care along with dicyclomine 4 times daily as needed, rehydration with electrolyte fluids and slow introduction of simple, solid foods back into your diet.  If your diarrhea persists for more than 2 weeks in total, I recommend that you see your primary care provider for stool cultures to rule out any other infectious cause.

## 2020-09-25 NOTE — ED Provider Notes (Signed)
[facility]    CSN: [id number] Arrival date & time: 09/25/20  1407      History   Chief Complaint Chief Complaint  Patient presents with   Generalized Body Aches   Diarrhea    HPI Joan Foley is a 45 y.o. female.   Pt report diarrhea that started 5 days ago, also having body aches, temp was 101.4 four days ago. Pt reports also feeling intermittently nauseous.  Patient denies sick contacts at work or at home.  Patient states she has never had diarrhea this long before.  States initially she was having liquid bowel movement every time she ate or drink anything, as that in the past 2 days her daytime episodes have decreased significantly however she is still having 3-4 episodes nightly, states abdominal cramping wakes her up in the middle of the night, denies being incontinent of feces or urine.  Patient states she has not noticed any blood in her stool, states not eating much at all, states per her scale at home she is lost 7 pounds since this began.  Patient denies any recent travel, eating any unusual foods or spoiled foods, states stool is predominantly liquid, has not had a formed stool since this began.  The history is provided by the patient.  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       Home Medications    Prior to Admission medications   Medication Sig Start Date End Date Taking? Authorizing Provider  dicyclomine (BENTYL) 10 MG capsule Take 1 capsule (10 mg total) by mouth 3 times/day as needed-between meals & bedtime for up to 10 days for spasms (for spasms, diarrhea). 09/25/20 10/05/20 Yes Theadora Rama Scales, PA-C  clonazePAM (KLONOPIN) 1 MG tablet Take 1/2 tab po in AM - not at bedtime. Patient taking differently: daily as needed. Take 1/2 tab po in AM - not at bedtime. 05/19/17   Dohmeier, Porfirio Mylar, MD  lamoTRIgine (LAMICTAL) 100 MG tablet Take 100 mg by mouth daily.    [provider]  sertraline (ZOLOFT) 100 MG tablet Take 150 mg by mouth daily.  01/18/15   [provider]  Sodium Oxybate (XYREM) 500 MG/ML SOLN 4.5g PO twice nightly. 06/20/20   Dohmeier, Porfirio Mylar, MD    Family History Family History  Problem Relation Age of Onset   Anxiety disorder Mother    Depression Mother    Narcolepsy Father    Depression Father    Anxiety disorder Father     Social History Social History   Tobacco Use   Smoking status: Former   Smokeless tobacco: Never   Tobacco comments:    quit smoking in 2005  Substance Use Topics   Alcohol  use: Yes    Alcohol/week: 0.0 standard drinks    Comment: socially   Drug use: No     Allergies   Patient has no known allergies.   Review of Systems Review of Systems Per HPI  Physical Exam Triage Vital Signs ED Triage Vitals  Enc Vitals Group     BP 09/25/20 1441 108/75     Pulse Rate 09/25/20 1441 (!) 104     Resp 09/25/20 1441 18     Temp 09/25/20 1441 98 F (36.7 C)     Temp Source 09/25/20 1441 Oral     SpO2 09/25/20 1441 97 %     Weight --      Height --      Head Circumference --      Peak Flow --      Pain Score 09/25/20 1439 5     Pain Loc --      Pain Edu? --      Excl. in GC? --    No data found.  Updated Vital Signs BP 108/75 (BP Location: Right Arm)   Pulse (!) 104   Temp 98  F (36.7 C) (Oral)   Resp 18   SpO2 97%   Visual Acuity Right Eye Distance:   Left Eye Distance:   Bilateral Distance:    Right Eye Near:   Left Eye Near:    Bilateral Near:     Physical Exam Vitals and nursing note reviewed.  Constitutional:      Appearance: Normal appearance.  HENT:     Head: Normocephalic and atraumatic.     Right Ear: Tympanic membrane, ear canal and external ear normal.     Left Ear: Tympanic membrane, ear canal and external ear normal.     Nose: Nose normal. No congestion or rhinorrhea.     Mouth/Throat:     Mouth: Mucous membranes are moist.     Pharynx: Oropharynx is clear. No oropharyngeal exudate or posterior oropharyngeal erythema.  Eyes:     General: No scleral icterus.    Extraocular Movements: Extraocular movements intact.     Conjunctiva/sclera: Conjunctivae normal.     Pupils: Pupils are equal, round, and reactive to light.  Cardiovascular:     Rate and Rhythm: Regular rhythm. Tachycardia present.     Pulses: Normal pulses.     Heart sounds: Normal heart sounds. No murmur heard.   No friction rub. No gallop.  Pulmonary:     Effort: Pulmonary effort is normal. No respiratory distress.     Breath sounds: Normal breath sounds. No wheezing, rhonchi or rales.  Abdominal:     General: Abdomen is flat. Bowel sounds are normal. There is no distension.     Palpations: Abdomen is soft. There is no mass.     Tenderness: There is abdominal tenderness (Mild, throughout). There is no right CVA tenderness, left CVA tenderness, guarding or rebound.     Hernia: No hernia is present.  Musculoskeletal:        General: No tenderness. Normal range of motion.     Cervical back: Normal range of motion and neck supple. No tenderness.  Lymphadenopathy:     Cervical: No cervical adenopathy.  Skin:    General: Skin is warm and dry.  Neurological:     General: No focal deficit present.     Mental Status: She is alert and oriented to person, place, and time.  Mental status is at baseline.     Cranial Nerves: No  cranial nerve deficit.     Motor: No weakness.  Psychiatric:        Mood and Affect: Mood normal.        Behavior: Behavior normal.        Thought Content: Thought content normal.        Judgment: Judgment normal.     UC Treatments / Results  Labs (all labs ordered are listed, but only abnormal results are displayed) Labs Reviewed  BASIC METABOLIC PANEL  CBC WITH DIFFERENTIAL/PLATELET    EKG   Radiology No results found.  Procedures Procedures (including critical care time)  Medications Ordered in UC Medications - No data to display  Initial Impression / Assessment and Plan / UC Course  I have reviewed the triage vital signs and the nursing notes.  Pertinent labs & imaging results that were available during my care of the patient were reviewed by me and considered in my medical decision making (see chart for details).     EKG performed in office today was concerning for SI, QIII, TIII.  At this time, patient denies any symptoms of shortness of breath, chest pain, chest tightness or altered mental status, states she has not had any of these symptoms in the past 5 days other than intermittent shortness of breath and chest tightness which is not new for her and does not interfere with any of her daily activities whatsoever.  Patient states she is never had a blood clot, currently not taking any estrogen for birth control, does not have a history of smoking any tobacco products.  Patient states she is adopted and therefore does not extensively know her biological parents medical history other than that her father had a heart attack around age 75.  Patient was given strict instructions to contact 911 if she experiences any acute shortness of breath, altered mental status or acute chest pain, patient verbalized understanding.    Patient was advised to begin dicyclomine 4 times daily as needed for diarrhea, and to slowly  reintroduce simple single ingredient foods back into her diet over the next few days.  I also encouraged her to avoid drinking water and to only consume electrolyte fluid while she continues to have diarrhea, approximately 6 to 8 ounces of electrolyte fluid per loose stool.  Patient was also advised to follow-up with her primary care provider should diarrhea persist for 2 weeks or longer so that she can have stool cultures done.Patient verbalized understanding and agreed with plan going forward.   Final Clinical Impressions(s) / UC Diagnoses   Final diagnoses:  Paroxysmal ventricular tachycardia (HCC)  Diarrhea, unspecified type     Discharge Instructions      Continue to monitor your diarrhea symptoms at home.  It is my opinion that your diarrhea is most likely related to a norovirus or some other common gastroenteritis.  As we discussed, replace water with an electrolyte fluid such as Gatorade, Powerade, Pedialyte, approximately 6 to 8 ounces per loose stool.  Please see the handout regarding food choices for relieving diarrhea to help you decide what to begin eating and hopefully get your appetite back.  As we discussed, your EKG was not completely normal.  I do not believe it is concerning for any heart disease or injury to your heart however there is an abnormal finding that could potentially be concerning for a pulmonary embolism which is a blood clot that travels from a distant part of your body into the lungs and blocks your lungs ability  to absorb oxygen.  At this time, I do not feel that any of the symptoms that you are having support this finding on her EKG.  However, she is due to begin to experience acute shortness of breath, pain in your chest, altered mental status is important that you call 911 immediately.  Please discuss this with your family members.  Once the results of your blood work is received today you will be contacted with those results.  If they are abnormal I will contact  you myself this evening and recommend treatment as needed.  Otherwise, please continue conservative care along with dicyclomine 4 times daily as needed, rehydration with electrolyte fluids and slow introduction of simple, solid foods back into your diet.  If your diarrhea persists for more than 2 weeks in total, I recommend that you see your primary care provider for stool cultures to rule out any other infectious cause.     ED Prescriptions     Medication Sig Dispense Auth. Provider   dicyclomine (BENTYL) 10 MG capsule Take 1 capsule (10 mg total) by mouth 3 times/day as needed-between meals & bedtime for up to 10 days for spasms (for spasms, diarrhea). 30 capsule Theadora Rama Scales, PA-C      PDMP not reviewed this encounter.   Theadora Rama Scales, PA-C 09/25/20 1551

## 2020-09-26 ENCOUNTER — Telehealth: Payer: Self-pay | Admitting: Emergency Medicine

## 2020-09-26 LAB — BASIC METABOLIC PANEL
BUN/Creatinine Ratio: 21 (ref 9–23)
BUN: 18 mg/dL (ref 6–24)
CO2: 23 mmol/L (ref 20–29)
Calcium: 9.1 mg/dL (ref 8.7–10.2)
Chloride: 92 mmol/L — ABNORMAL LOW (ref 96–106)
Creatinine, Ser: 0.84 mg/dL (ref 0.57–1.00)
Glucose: 86 mg/dL (ref 65–99)
Potassium: 3.2 mmol/L — ABNORMAL LOW (ref 3.5–5.2)
Sodium: 136 mmol/L (ref 134–144)
eGFR: 87 mL/min/{1.73_m2} (ref >59)

## 2020-09-26 LAB — CBC WITH DIFFERENTIAL/PLATELET
Basophils Absolute: 0 10*3/uL (ref 0.0–0.2)
Basos: 0 %
EOS (ABSOLUTE): 0.1 10*3/uL (ref 0.0–0.4)
Eos: 1 %
Hematocrit: 40.6 % (ref 34.0–46.6)
Hemoglobin: 14.7 g/dL (ref 11.1–15.9)
Immature Grans (Abs): 0 10*3/uL (ref 0.0–0.1)
Immature Granulocytes: 0 %
Lymphocytes Absolute: 1.6 10*3/uL (ref 0.7–3.1)
Lymphs: 18 %
MCH: 32.8 pg (ref 26.6–33.0)
MCHC: 36.2 g/dL — ABNORMAL HIGH (ref 31.5–35.7)
MCV: 91 fL (ref 79–97)
Monocytes Absolute: 1 10*3/uL — ABNORMAL HIGH (ref 0.1–0.9)
Monocytes: 11 %
Neutrophils Absolute: 6.5 10*3/uL (ref 1.4–7.0)
Neutrophils: 70 %
Platelets: 209 10*3/uL (ref 150–450)
RBC: 4.48 x10E6/uL (ref 3.77–5.28)
RDW: 12.7 % (ref 11.7–15.4)
WBC: 9.2 10*3/uL (ref 3.4–10.8)

## 2020-09-26 MED ORDER — POTASSIUM CHLORIDE CRYS ER 20 MEQ PO TBCR: 20.0000 meq | EXTENDED_RELEASE_TABLET | Freq: Every day | ORAL | 0 refills | Status: DC

## 2020-09-26 NOTE — Telephone Encounter (Signed)
Patient presented with 5 days of diarrhea on September 19, basic metabolic panel showed potassium level of 3.2.  Potassium chloride supplement was sent to pharmacy for patient to take 20 mill equivalents for the next 3 days.

## 2020-09-26 NOTE — Telephone Encounter (Signed)
Patient was contacted to advise her that her potassium level was 3.2 on September 19.  Patient was advised to pick up prescription for potassium chloride, take 1 tablet daily for 3 days.

## 2020-11-22 ENCOUNTER — Other Ambulatory Visit: Payer: Self-pay | Admitting: Neurology

## 2020-11-22 DIAGNOSIS — G47411 Narcolepsy with cataplexy: Secondary | ICD-10-CM

## 2020-11-22 MED ORDER — XYREM 500 MG/ML PO SOLN: ORAL | 5 refills | Status: DC

## 2020-12-11 ENCOUNTER — Telehealth: Payer: Self-pay | Admitting: Adult Health

## 2020-12-11 ENCOUNTER — Telehealth: Payer: 59 | Admitting: Adult Health

## 2020-12-11 NOTE — Telephone Encounter (Signed)
Called pt & LVM returning her call.

## 2020-12-11 NOTE — Telephone Encounter (Signed)
Pt states its time for her Sodium Oxybate (XYREM) 500 MG/ML SOLN

## 2020-12-11 NOTE — Telephone Encounter (Signed)
This was refilled by Sharrie Rothman on 11/22/20.

## 2020-12-11 NOTE — Telephone Encounter (Signed)
If the pt calls back, please give her the message below.

## 2021-01-17 ENCOUNTER — Telehealth: Payer: Self-pay | Admitting: Adult Health

## 2021-01-17 ENCOUNTER — Telehealth: Payer: Self-pay | Admitting: Neurology

## 2021-01-17 NOTE — Telephone Encounter (Signed)
[facility] has called re: a 4 day loss of medication (Sodium Oxybate (XYREM) 500 MG/ML SOLN).  There were no confirmed leaks or anything but there is a need for a 2 day early refill needing to be shipped today for pt.  Please call

## 2021-01-17 NOTE — Telephone Encounter (Signed)
I called pt.  She states her last dose was Monday PM.  She states her insurance plan changed but still with the same insurance [employer].  She states that she never has several days in her bottle of medication, usually up to day maybe 1 day more.  Looks like PA was done today by CB/RN for Xyrem. Needing ok early 2 day refill  to Bridge pt until she get her medication.

## 2021-01-17 NOTE — Telephone Encounter (Signed)
Dr Vickey Huger would be ok with a bridge for the patient. Called the pharmacist and was able to provided the early refill for the patient.  They will get it shipped out today

## 2021-01-17 NOTE — Telephone Encounter (Signed)
PA completed through [facility] KEY:[id number] Will await response

## 2021-01-18 ENCOUNTER — Telehealth: Payer: Self-pay | Admitting: Adult Health

## 2021-01-18 NOTE — Telephone Encounter (Signed)
[employer] Joseph Art) PA denied by plan yesterday. Would like to know when appeal will be submitted. Informed her of previous TE appeal will be submitted.

## 2021-01-18 NOTE — Telephone Encounter (Signed)
PA was denied: [employer] All [employer] and logos are owned by [employer]. All other brand or product names are trademarks or registered marks of their respective owners.  2022 [employer] All rights reserved. [phone] Foley 1 of 11 [id number] PA [id number] 01/17/2021 [facility] [address] [place], [place] [address] Plan member ID: [id number] Case number: [id number] Prescriber name: Melvyn Novas Prescriber fax: [phone] NOTICE OF DENIAL Dear Joan Foley, On behalf of [employer], [employer] is responsible for reviewing pharmacy services provided to [facility] members. We received a request from your prescriber for coverage of Xyrem Sol 500mg /Ml. We reviewed all of the information you and/or your doctor sent to and sent the information to an appropriate physician specialist if needed. Unfortunately, we must deny coverage for Xyrem. Why was my request denied? This request was denied because you did not meet the following clinical requirements: Based on the information provided, you do not meet the established medication specific criteria or guidelines for Xyrem at this time. The request for coverage for XYREM SOL 500MG /ML, use as directed (540 per month), is denied. This decision is based on health plan criteria for XYREM SOL 500MG /ML. This medicine is covered only if: (1) Submission of medical records (for example: chart notes, lab values) documenting a diagnosis of narcolepsy without cataplexy (defined as Narcolepsy Type 2) with all of the following: (A) You have daily periods of irrepressible need to sleep or daytime lapses into sleep occurring for at least three months. (B) A mean sleep latency of less than or equal to 8 minutes and two or more sleep onset rapid eye movement periods are found on a multiple sleep latency test performed according to standard techniques following a normal overnight polysomnogram. A sleep onset  rapid eye movement period (within 15 minutes of sleep onset) on the preceding nocturnal polysomnogram may replace one of the sleep onset rapid eye movement periods on the multiple sleep latency test. (2) You have failed or cannot use [employer]*.  We will submit appeal

## 2021-01-23 ENCOUNTER — Telehealth (INDEPENDENT_AMBULATORY_CARE_PROVIDER_SITE_OTHER): Payer: 59 | Admitting: Adult Health

## 2021-01-23 DIAGNOSIS — G47419 Narcolepsy without cataplexy: Secondary | ICD-10-CM | POA: Diagnosis not present

## 2021-01-23 NOTE — Progress Notes (Signed)
PATIENT: Joan Foley DOB: [birth year]  REASON FOR VISIT: follow up HISTORY FROM: patient  Virtual Visit via Video Note  I connected with Joan Foley on 01/23/21 at  1:45 PM EST by a video enabled telemedicine application located remotely at [facility] and verified that I am speaking with the correct person using two identifiers who was located at their own home.   I discussed the limitations of evaluation and management by telemedicine and the availability of in person appointments. The patient expressed understanding and agreed to proceed.   PATIENT: Joan Foley DOB: 1975-09-24  REASON FOR VISIT: follow up HISTORY FROM: patient  HISTORY OF PRESENT ILLNESS: Today 01/23/21:  Ms. 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  She returns today for follow-up.  She is currently on Xyrem 4.5 mg twice nightly.  She states that her medication was recently denied.  Our office is working on an appeal.  She states that it continues to work well for her.  Denies falling asleep while working or driving.  Denies any changes with her depression or anxiety.  No thoughts of harming herself or others.  HISTORY 06/07/20:   Ms. 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  She remains on Xyrem 4.5 g twice nightly.  She reports that this continues to work well for her.  She denies falling asleep while working or driving.  Denies any thoughts of harming herself or others.  Feels that her depression is well controlled.  She continues to take Klonopin prescribed by her psychiatrist.    REVIEW OF SYSTEMS: Out of a complete 14 system review of symptoms, the patient complains only of the following symptoms, and all other reviewed systems are negative.  ALLERGIES: No Known Allergies  HOME MEDICATIONS: Outpatient Medications Prior to Visit  Medication Sig Dispense Refill   clonazePAM (KLONOPIN) 1 MG tablet Take 1/2 tab po in  AM - not at bedtime. (Patient taking differently: daily as needed. Take 1/2 tab po in AM - not at bedtime.) 30 tablet 1   dicyclomine (BENTYL) 10 MG capsule Take 1 capsule (10 mg total) by mouth 3 times/day as needed-between meals & bedtime for up to 10 days for spasms (for spasms, diarrhea). 30 capsule 0   lamoTRIgine (LAMICTAL) 100 MG tablet Take 100 mg by mouth daily.     potassium chloride SA (KLOR-CON) 20 MEQ tablet Take 1 tablet (20 mEq total) by mouth daily for 3 days. 3 tablet 0   sertraline (ZOLOFT) 100 MG tablet Take 150 mg by mouth daily.   5   Sodium Oxybate (XYREM) 500 MG/ML SOLN 4.5g PO twice nightly. 540 mL 5   No facility-administered medications prior to visit.    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    PAST SURGICAL HISTORY: Past Surgical History:  Procedure Laterality Date   heart disease     valve   LITHOTRIPSY     with renal stones    FAMILY HISTORY: Family History  Problem Relation Age of Onset   Anxiety disorder Mother    Depression Mother    Narcolepsy Father    Depression Father    Anxiety  disorder Father     SOCIAL HISTORY: Social History   Socioeconomic History   Marital status: Married    Spouse name: scott   Number of children: 2   Years of education: college   Highest education level: Not on file  Occupational History   Occupation: [employer]  Tobacco Use   Smoking status: Former   Smokeless tobacco: Never   Tobacco comments:    quit smoking in 2005  Substance and Sexual Activity   Alcohol use: Yes    Alcohol/week: 0.0 standard drinks    Comment: socially   Drug use: No   Sexual activity: Not on file  Other Topics Concern   Not on file  Social History Narrative   Patient lives at home with her  husband [name].)   Patient works full time.   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   Caffeine none.   Social Determinants of Health   Financial Resource Strain: Not on file  Food Insecurity: Not on file  Transportation Needs: Not on file  Physical Activity: Not on file  Stress: Not on file  Social Connections: Not on file  Intimate Partner Violence: Not on file      PHYSICAL EXAM Generalized: Well developed, in no acute distress   Neurological examination  Mentation: Alert oriented to time, place, history taking. Follows all commands speech and language fluent Cranial nerve II-XII:Extraocular movements were full. Facial symmetry noted. uvula tongue midline. Head turning and shoulder shrug  were normal and symmetric. Motor: Good strength throughout subjectively per patient Sensory: Sensory testing is intact to soft touch on all 4 extremities subjectively per patient Coordination: Cerebellar testing reveals good finger-nose-finger  Gait and station: Patient is able to stand from a seated position. gait is normal.  Reflexes: UTA  DIAGNOSTIC DATA (LABS, IMAGING, TESTING) - I reviewed patient records, labs, notes, testing and imaging myself where available.  Lab Results  Component Value Date   WBC 9.2 09/25/2020   HGB 14.7 09/25/2020   HCT 40.6 09/25/2020   MCV 91 09/25/2020   PLT 209 09/25/2020      Component Value Date/Time   NA 136 09/25/2020 1515   K 3.2 (L) 09/25/2020 1515   CL 92 (L) 09/25/2020 1515   CO2 23 09/25/2020 1515   GLUCOSE 86 09/25/2020 1515   BUN 18 09/25/2020 1515   CREATININE 0.84 09/25/2020 1515   CALCIUM 9.1 09/25/2020 1515   PROT 6.8 06/07/2020 1149   ALBUMIN 4.6 06/07/2020 1149   AST 13 06/07/2020 1149   ALT 13 06/07/2020 1149   ALKPHOS 59 06/07/2020 1149   BILITOT 0.3 06/07/2020 1149   GFRNONAA 88 05/25/2019 1117   GFRAA 101 05/25/2019 1117   Lab Results  Component Value Date   CHOL 185 05/25/2019   HDL 87 05/25/2019   LDLCALC 85  05/25/2019   TRIG 71 05/25/2019   CHOLHDL 2.1 05/25/2019   No results found for: HGBA1C No results found for: VITAMINB12 Lab Results  Component Value Date   TSH 1.390 05/20/2018      ASSESSMENT AND PLAN 46 y.o. 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 here with:  1.  Narcolepsy  Continue Xyrem 4.5 g twice nightly. We will check blood work at the next visit Follow-up in 6 months or sooner if needed      Butch Penny, MSN, NP-C 01/23/2021, 1:41 PM [facility]  [address], [place], [place] [address] [phone]

## 2021-02-12 NOTE — Telephone Encounter (Signed)
PA approved for the pt through [facility] from 01/21/2021 until 01/21/2022. Ref #: [id number]

## 2021-04-24 ENCOUNTER — Other Ambulatory Visit: Payer: Self-pay | Admitting: Neurology

## 2021-04-24 DIAGNOSIS — G47411 Narcolepsy with cataplexy: Secondary | ICD-10-CM

## 2021-04-24 MED ORDER — SODIUM OXYBATE 500 MG/ML PO SOLN: ORAL | 3 refills | Status: DC

## 2021-07-25 ENCOUNTER — Encounter: Payer: Self-pay | Admitting: Adult Health

## 2021-07-25 ENCOUNTER — Ambulatory Visit: Payer: 59 | Admitting: Adult Health

## 2021-07-25 VITALS — BP 128/71 | HR 69 | Ht 64.0 in | Wt 148.0 lb

## 2021-07-25 DIAGNOSIS — G47419 Narcolepsy without cataplexy: Secondary | ICD-10-CM | POA: Diagnosis not present

## 2021-07-25 NOTE — Progress Notes (Signed)
PATIENT: Joan Foley DOB: 26-Nov-1975  REASON FOR VISIT: follow up HISTORY FROM: patient PRIMARY NEUROLOGIST: Dr. Vickey Huger  Chief Complaint  Patient presents with   Advice Only    Rm 19 here for f/u on narcolepsy- reports she has been doing ok- she has noticed her body feels achy and at times will wake her up from her sleep.      HISTORY OF PRESENT ILLNESS: Today 07/25/21:  Joan Foley is a 46 year old female with a history of narcolepsy.  She returns today for follow-up.  Remains on Xyrem 4.5 mg twice nightly.  She feels that the medication continues to work well for her.  She has noticed that sometimes she gets body aches during the night that will wake her up. Has not dicussed with PCP- in the process of getting established. Denies any thoughts of harming herself or others.  Feels that her depression is well managed at this time.  HISTORY 01/23/21:   Joan Foley is a 46 year old female with a history of narcolepsy.  She returns today for follow-up.  She is currently on Xyrem 4.5 mg twice nightly.  She states that her medication was recently denied.  Our office is working on an appeal.  She states that it continues to work well for her.  Denies falling asleep while working or driving.  Denies any changes with her depression or anxiety.  No thoughts of harming herself or others.  REVIEW OF SYSTEMS: Out of a complete 14 system review of symptoms, the patient complains only of the following symptoms, and all other reviewed systems are negative.  ALLERGIES: No Known Allergies  HOME MEDICATIONS: Outpatient Medications Prior to Visit  Medication Sig Dispense Refill   clonazePAM (KLONOPIN) 1 MG tablet Take 1/2 tab po in AM - not at bedtime. (Patient taking differently: daily as needed. Take 1/2 tab po in AM - not at bedtime.) 30 tablet 1   lamoTRIgine (LAMICTAL) 100 MG tablet Take 100 mg by mouth daily.     sertraline (ZOLOFT) 100 MG tablet Take 150 mg by mouth daily.   5    Sodium Oxybate (XYREM) 500 MG/ML SOLN 4.5g PO twice nightly. 540 mL 3   dicyclomine (BENTYL) 10 MG capsule Take 1 capsule (10 mg total) by mouth 3 times/day as needed-between meals & bedtime for up to 10 days for spasms (for spasms, diarrhea). 30 capsule 0   potassium chloride SA (KLOR-CON) 20 MEQ tablet Take 1 tablet (20 mEq total) by mouth daily for 3 days. 3 tablet 0   No facility-administered medications prior to visit.    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    PAST SURGICAL HISTORY: Past Surgical History:  Procedure Laterality Date   heart disease     valve   LITHOTRIPSY     with renal stones    FAMILY HISTORY: Family History  Problem Relation Age of Onset   Anxiety disorder Mother    Depression Mother    Narcolepsy Father    Depression Father    Anxiety disorder Father     SOCIAL HISTORY: Social History   Socioeconomic History   Marital status: Married    Spouse name: scott   Number of  children: 2   Years of education: college   Highest education level: Not on file  Occupational History   Occupation: [employer]  Tobacco Use   Smoking status: Former   Smokeless tobacco: Never   Tobacco comments:    quit smoking in 2005  Substance and Sexual Activity   Alcohol use: Yes    Alcohol/week: 0.0 standard drinks of alcohol    Comment: socially   Drug use: No   Sexual activity: Not on file  Other Topics Concern   Not on file  Social History Narrative   Patient lives at home with her husband Nicki Reaper.)   Patient works full time.   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   Caffeine none.   Social Determinants of Health   Financial Resource Strain: Not on file  Food Insecurity: Not on file  Transportation Needs: Not on file   Physical Activity: Not on file  Stress: Not on file  Social Connections: Not on file  Intimate Partner Violence: Not on file      PHYSICAL EXAM  Vitals:   07/25/21 1051  BP: 128/71  Pulse: 69  Weight: 148 lb (67.1 kg)  Height: 5\' 4"  (1.626 m)   Body mass index is 25.4 kg/m.  Generalized: Well developed, in no acute distress   Neurological examination  Mentation: Alert oriented to time, place, history taking. Follows all commands speech and language fluent Cranial nerve II-XII: Pupils were equal round reactive to light. Extraocular movements were full, visual field were full on confrontational test. Facial sensation and strength were normal. Head turning and shoulder shrug  were normal and symmetric. Motor: The motor testing reveals 5 over 5 strength of all 4 extremities. Good symmetric motor tone is noted throughout.  Sensory: Sensory testing is intact to soft touch on all 4 extremities. No evidence of extinction is noted.  Coordination: Cerebellar testing reveals good finger-nose-finger and heel-to-shin bilaterally.  Gait and station: Gait is normal. Tandem gait is normal. Romberg is negative. No drift is seen.     DIAGNOSTIC DATA (LABS, IMAGING, TESTING) - I reviewed patient records, labs, notes, testing and imaging myself where available.  Lab Results  Component Value Date   WBC 9.2 09/25/2020   HGB 14.7 09/25/2020   HCT 40.6 09/25/2020   MCV 91 09/25/2020   PLT 209 09/25/2020      Component Value Date/Time   NA 136 09/25/2020 1515   K 3.2 (L) 09/25/2020 1515   CL 92 (L) 09/25/2020 1515   CO2 23 09/25/2020 1515   GLUCOSE 86 09/25/2020 1515   BUN 18 09/25/2020 1515   CREATININE 0.84 09/25/2020 1515   CALCIUM 9.1 09/25/2020 1515   PROT 6.8 06/07/2020 1149   ALBUMIN 4.6 06/07/2020 1149   AST 13 06/07/2020 1149   ALT 13 06/07/2020 1149   ALKPHOS 59 06/07/2020 1149   BILITOT 0.3 06/07/2020 1149   GFRNONAA 88 05/25/2019 1117   GFRAA 101 05/25/2019 1117    Lab Results  Component Value Date   CHOL 185 05/25/2019   HDL 87 05/25/2019   LDLCALC 85 05/25/2019   TRIG 71 05/25/2019   CHOLHDL 2.1 05/25/2019    Lab Results  Component Value Date   TSH 1.390 05/20/2018      ASSESSMENT AND PLAN 46 y.o. 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 here with:  Narcolepsy  Continue Xyrem 4.5 mg twice  nightly Blood work today Encouraged the patient to get established with PCP to discuss ongoing muscle aches       Butch Penny, MSN, NP-C 07/25/2021, 11:02 AM [facility] [address], [address] [place], [place] [address] [phone]

## 2021-07-25 NOTE — Patient Instructions (Signed)
Your Plan:  Continue xyrem Blood work today     Thank you for coming to see [place] at [facility]. I hope we have been able to provide you high quality care today.  You may receive a patient satisfaction survey over the next few weeks. We would appreciate your feedback and comments so that we may continue to improve ourselves and the health of our patients.

## 2021-07-26 LAB — COMPREHENSIVE METABOLIC PANEL
ALT: 9 IU/L (ref 0–32)
AST: 16 IU/L (ref 0–40)
Albumin/Globulin Ratio: 2.3 — ABNORMAL HIGH (ref 1.2–2.2)
Albumin: 4.6 g/dL (ref 3.9–4.9)
Alkaline Phosphatase: 54 IU/L (ref 44–121)
BUN/Creatinine Ratio: 19 (ref 9–23)
BUN: 14 mg/dL (ref 6–24)
Bilirubin Total: 0.5 mg/dL (ref 0.0–1.2)
CO2: 21 mmol/L (ref 20–29)
Calcium: 9.3 mg/dL (ref 8.7–10.2)
Chloride: 105 mmol/L (ref 96–106)
Creatinine, Ser: 0.75 mg/dL (ref 0.57–1.00)
Globulin, Total: 2 g/dL (ref 1.5–4.5)
Glucose: 90 mg/dL (ref 70–99)
Potassium: 4.4 mmol/L (ref 3.5–5.2)
Sodium: 139 mmol/L (ref 134–144)
Total Protein: 6.6 g/dL (ref 6.0–8.5)
eGFR: 100 mL/min/{1.73_m2} (ref >59)

## 2021-08-20 ENCOUNTER — Telehealth: Payer: Self-pay | Admitting: Adult Health

## 2021-08-20 NOTE — Telephone Encounter (Signed)
Belenda Cruise is calling from [facility] to request a eight day early refill because Pt is going on vacation. Belenda Cruise is requesting a call-back.

## 2021-08-20 NOTE — Telephone Encounter (Signed)
agree

## 2021-08-20 NOTE — Telephone Encounter (Signed)
I called [facility] for xyrem.  Pt will be going out of town from 8/18th thru 8/26 will be gone for shipment.  I ok'd the 8 day early shipment for her ordered 8/16, shipped 8/17 per Casey/pharmacist.  Pt last seen 07/25/2021 (continue xyrem).

## 2021-09-03 ENCOUNTER — Other Ambulatory Visit: Payer: Self-pay | Admitting: Neurology

## 2021-09-03 DIAGNOSIS — G47411 Narcolepsy with cataplexy: Secondary | ICD-10-CM

## 2021-09-03 MED ORDER — SODIUM OXYBATE 500 MG/ML PO SOLN: ORAL | 5 refills | Status: DC

## 2021-12-26 ENCOUNTER — Telehealth: Payer: Self-pay | Admitting: *Deleted

## 2021-12-26 NOTE — Telephone Encounter (Signed)
Joan Foley [name] - PA Case ID: [id number]   PA Sodium Oxybate complete waiting on approval

## 2021-12-27 NOTE — Telephone Encounter (Signed)
Received fax from [facility] that PA done previously ID [id number] Pa [id number]  Approved 12-24-2021 to 12-25-2022   call [facility] if questions [phone]

## 2021-12-27 NOTE — Telephone Encounter (Signed)
Fax confirmation received to [facility] [phone].

## 2022-01-22 ENCOUNTER — Telehealth (INDEPENDENT_AMBULATORY_CARE_PROVIDER_SITE_OTHER): Payer: 59 | Admitting: Adult Health

## 2022-01-22 DIAGNOSIS — G47419 Narcolepsy without cataplexy: Secondary | ICD-10-CM | POA: Diagnosis not present

## 2022-01-22 NOTE — Progress Notes (Signed)
PATIENT: Joan Foley DOB: 1975-02-16  REASON FOR VISIT: follow up HISTORY FROM: patient  Virtual Visit via Video Note  I connected with Joan Foley on 01/22/22 at  2:15 PM EST by a video enabled telemedicine application located remotely at [facility] and verified that I am speaking with the correct person using two identifiers who was located at their own home.   I discussed the limitations of evaluation and management by telemedicine and the availability of in person appointments. The patient expressed understanding and agreed to proceed.   PATIENT: Joan Foley DOB: 1975-03-17  REASON FOR VISIT: follow up HISTORY FROM: patient  HISTORY OF PRESENT ILLNESS: Today 01/22/22:  Joan Foley is a 47 y.o. 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 Returns today for follow-up.  She continues on Xyrem 4.5 mg twice a day.  Denies any new issues.  Feels that Xyrem works well for her.  Denies any issues with falling asleep while driving or at work.  No change in mood or behavior.  Denies having suicidal thoughts.     07/25/21:   Joan Foley is a 47 year old female with a history of narcolepsy.  She returns today for follow-up.  Remains on Xyrem 4.5 mg twice nightly.  She feels that the medication continues to work well for her.  She has noticed that sometimes she gets body aches during the night that will wake her up. Has not dicussed with PCP- in the process of getting established. Denies any thoughts of harming herself or others.  Feels that her depression is well managed at this time.  01/23/21: Joan Foley is a 47 year old female with a history of narcolepsy.  She returns today for follow-up.  She is currently on Xyrem 4.5 mg twice nightly.  She states that her medication was recently denied.  Our office is working on an appeal.  She states that it continues to work well for her.  Denies falling asleep while working or driving.  Denies any  changes with her depression or anxiety.  No thoughts of harming herself or others.  HISTORY 06/07/20:   Joan Foley is a 47 year old female with a history of narcolepsy.  She remains on Xyrem 4.5 g twice nightly.  She reports that this continues to work well for her.  She denies falling asleep while working or driving.  Denies any thoughts of harming herself or others.  Feels that her depression is well controlled.  She continues to take Klonopin prescribed by her psychiatrist.    REVIEW OF SYSTEMS: Out of a complete 14 system review of symptoms, the patient complains only of the following symptoms, and all other reviewed systems are negative.  ALLERGIES: No Known Allergies  HOME MEDICATIONS: Outpatient Medications Prior to Visit  Medication Sig Dispense Refill   clonazePAM (KLONOPIN) 1 MG tablet Take 1/2 tab po in AM - not at bedtime. (Patient taking differently: daily as needed. Take 1/2 tab po in AM - not at bedtime.) 30 tablet 1   lamoTRIgine (LAMICTAL) 100 MG tablet Take 100 mg by mouth daily.     sertraline (ZOLOFT) 100 MG tablet Take 150 mg by mouth daily.   5   Sodium Oxybate (XYREM) 500 MG/ML SOLN 4.5g PO twice nightly. 540 mL 5   No facility-administered medications prior to visit.    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    PAST SURGICAL HISTORY: Past Surgical History:  Procedure Laterality Date   heart disease     valve   LITHOTRIPSY     with renal stones    FAMILY HISTORY: Family History  Problem Relation Age of Onset   Anxiety disorder Mother    Depression Mother    Narcolepsy Father    Depression Father    Anxiety disorder Father     SOCIAL HISTORY: Social History   Socioeconomic History   Marital status: Married    Spouse  name: Joan Foley   Number of children: 2   Years of education: college   Highest education level: Not on file  Occupational History   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   Caffeine none.   Social Determinants of Health   Financial Resource Strain: Not on file  Food Insecurity: Not on file  Transportation Needs: Not on file  Physical Activity: Not on file  Stress: Not on file  Social Connections: Not on file  Intimate Partner Violence: Not on file      PHYSICAL EXAM Generalized: Well developed, in no acute distress   Neurological examination  Mentation: Alert oriented to time, place, history taking. Follows all commands speech and language fluent Cranial nerve II-XII:Extraocular movements were full.  Head turning and shoulder shrug  were normal and symmetric.  DIAGNOSTIC DATA (LABS, IMAGING, TESTING) - I reviewed patient records, labs, notes, testing and imaging myself where available.  Lab Results  Component Value Date   WBC 9.2 09/25/2020   HGB 14.7 09/25/2020   HCT 40.6 09/25/2020   MCV 91 09/25/2020   PLT 209 09/25/2020      Component Value Date/Time   NA 139 07/25/2021 1119   K 4.4 07/25/2021 1119   CL 105 07/25/2021 1119   CO2 21 07/25/2021 1119   GLUCOSE 90 07/25/2021 1119   BUN 14 07/25/2021 1119   CREATININE 0.75 07/25/2021 1119   CALCIUM 9.3 07/25/2021 1119   PROT 6.6 07/25/2021 1119   ALBUMIN 4.6 07/25/2021 1119   AST 16 07/25/2021 1119   ALT 9 07/25/2021 1119   ALKPHOS 54 07/25/2021 1119   BILITOT 0.5  07/25/2021 1119   GFRNONAA 88 05/25/2019 1117   GFRAA 101 05/25/2019 1117   Lab Results  Component Value Date   CHOL 185 05/25/2019   HDL 87 05/25/2019   LDLCALC 85 05/25/2019   TRIG 71 05/25/2019   CHOLHDL 2.1 05/25/2019    Lab Results  Component Value Date   TSH 1.390 05/20/2018      ASSESSMENT AND PLAN 47 y.o. 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 here with:  1.  Narcolepsy  Continue Xyrem  4.5 g twice nightly. Blood work completed at the last visit was in normal range Follow-up in 6 months or sooner if needed      Butch Penny, MSN, NP-C 01/22/2022, 1:59 PM [facility] [address] [place], [place] [address] [phone]

## 2022-02-12 ENCOUNTER — Other Ambulatory Visit: Payer: Self-pay | Admitting: Neurology

## 2022-02-12 DIAGNOSIS — G47411 Narcolepsy with cataplexy: Secondary | ICD-10-CM

## 2022-02-12 MED ORDER — SODIUM OXYBATE 500 MG/ML PO SOLN: ORAL | 5 refills | Status: DC

## 2022-03-01 LAB — COLOGUARD: COLOGUARD: NEGATIVE

## 2022-03-12 ENCOUNTER — Telehealth: Payer: Self-pay | Admitting: Adult Health

## 2022-03-12 DIAGNOSIS — G47411 Narcolepsy with cataplexy: Secondary | ICD-10-CM

## 2022-03-12 MED ORDER — SODIUM OXYBATE 500 MG/ML PO SOLN: ORAL | 5 refills | Status: DC

## 2022-03-12 NOTE — Telephone Encounter (Addendum)
  Last visit 01/22/22 Next visit 07/30/22  I spoke with Elmyra Ricks in [place]. I was able to provide the verbal order for sodium oxybate 500 mg/mL 4.5 grams PO twice nightly, refills 5, 1 month supply. She verbalized understanding and confirmed via read back.

## 2022-03-12 NOTE — Telephone Encounter (Signed)
Joan Foley called from [facility] requesting a refill on Sodium Oxybate (XYREM) 500 MG/ML SOLN. Should be sent to [facility]

## 2022-07-09 ENCOUNTER — Telehealth (INDEPENDENT_AMBULATORY_CARE_PROVIDER_SITE_OTHER): Payer: 59 | Admitting: Adult Health

## 2022-07-09 DIAGNOSIS — G47419 Narcolepsy without cataplexy: Secondary | ICD-10-CM

## 2022-07-09 NOTE — Progress Notes (Signed)
PATIENT: Joan Foley DOB: 15-Jul-1975  REASON FOR VISIT: follow up HISTORY FROM: patient  Virtual Visit via Video Note  I connected with Joan Foley on 07/09/22 at  3:15 PM EDT by a video enabled telemedicine application located remotely at [facility] and verified that I am speaking with the correct person using two identifiers who was located at their own home.   I discussed the limitations of evaluation and management by telemedicine and the availability of in person appointments. The patient expressed understanding and agreed to proceed.   PATIENT: Joan Foley DOB: September 26, 1975  REASON FOR VISIT: follow up HISTORY FROM: patient  HISTORY OF PRESENT ILLNESS: Today 07/09/22:  Joan Foley is a 47 y.o. 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 Returns today for follow-up.  She remains on Xyrem 4.5 mg twice a day.  This continues to work well for her.  Denies any new issues.  No change in her depression.  No suicidal thoughts.   01/22/22: Joan Foley is a 47 y.o. 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 Returns today for follow-up.  She continues on Xyrem 4.5 mg twice a day.  Denies any new issues.  Feels that Xyrem works well for her.  Denies any issues with falling asleep while driving or at work.  No change in mood or behavior.  Denies having suicidal thoughts.   07/25/21: Joan Foley is a 47 year old female with a history of narcolepsy.  She returns today for follow-up.  Remains on Xyrem 4.5 mg twice nightly.  She feels that the medication continues to work well for her.  She has noticed that sometimes she gets body aches during the night that will wake her up. Has not dicussed with PCP- in the process of getting established. Denies any thoughts of harming herself or others.  Feels that her depression is well managed at this time.  01/23/21: Joan Foley is a 47 year old female with a history of narcolepsy.  She returns today for  follow-up.  She is currently on Xyrem 4.5 mg twice nightly.  She states that her medication was recently denied.  Our office is working on an appeal.  She states that it continues to work well for her.  Denies falling asleep while working or driving.  Denies any changes with her depression or anxiety.  No thoughts of harming herself or others.  06/07/20:   Joan Foley is a 47 year old female with a history of narcolepsy.  She remains on Xyrem 4.5 g twice nightly.  She reports that this continues to work well for her.  She denies falling asleep while working or driving.  Denies any thoughts of harming herself or others.  Feels that her depression is well controlled.  She continues to take Klonopin prescribed by her psychiatrist.    REVIEW OF SYSTEMS: Out of a complete 14 system review of symptoms, the patient complains only of the following symptoms, and all other reviewed systems are negative.  ALLERGIES: No Known Allergies  HOME MEDICATIONS: Outpatient Medications Prior to Visit  Medication Sig Dispense Refill   clonazePAM (KLONOPIN) 1 MG tablet Take 1/2 tab po in AM - not at bedtime. (Patient taking differently: daily as needed. Take 1/2 tab po in AM - not at bedtime.) 30 tablet 1   lamoTRIgine (LAMICTAL) 100 MG tablet Take 100 mg by mouth daily.     sertraline (ZOLOFT) 100 MG tablet Take 150 mg by mouth daily.   5   Sodium Oxybate (  XYREM) 500 MG/ML SOLN 4.5g PO twice nightly. 540 mL 5   No facility-administered medications prior to visit.    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    PAST SURGICAL HISTORY: Past Surgical History:  Procedure Laterality Date   heart disease     valve   LITHOTRIPSY     with renal stones    FAMILY HISTORY: Family History   Problem Relation Age of Onset   Anxiety disorder Mother    Depression Mother    Narcolepsy Father    Depression Father    Anxiety disorder Father     SOCIAL HISTORY: Social History   Socioeconomic History   Marital status: Married    Spouse name: scott   Number of children: 2   Years of education: college   Highest education level: Not on file  Occupational History   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   Caffeine none.   Social Determinants of Health   Financial Resource Strain: Not on file  Food Insecurity: Not on file  Transportation Needs: Not on file  Physical Activity: Not on file  Stress: Not on file  Social Connections: Not on file  Intimate Partner Violence: Not on file      PHYSICAL EXAM Generalized: Well developed, in no acute distress   Neurological examination  Mentation: Alert oriented to time, place, history taking. Follows all commands speech and language fluent Cranial nerve II-XII:Extraocular movements were full.  Head turning and shoulder shrug  were normal and symmetric.  DIAGNOSTIC DATA (LABS, IMAGING, TESTING) - I reviewed patient records, labs, notes, testing and imaging myself where available.  Lab Results  Component Value Date   WBC 9.2 09/25/2020   HGB 14.7 09/25/2020   HCT 40.6 09/25/2020   MCV 91 09/25/2020   PLT 209 09/25/2020      Component Value Date/Time   NA 139 07/25/2021 1119   K 4.4 07/25/2021 1119   CL 105 07/25/2021 1119   CO2 21 07/25/2021 1119   GLUCOSE 90  07/25/2021 1119   BUN 14 07/25/2021 1119   CREATININE 0.75 07/25/2021 1119   CALCIUM 9.3 07/25/2021 1119   PROT 6.6 07/25/2021 1119   ALBUMIN 4.6 07/25/2021 1119   AST 16 07/25/2021 1119   ALT 9 07/25/2021 1119   ALKPHOS 54 07/25/2021 1119   BILITOT 0.5 07/25/2021 1119   GFRNONAA 88 05/25/2019 1117   GFRAA 101 05/25/2019 1117   Lab Results  Component Value Date   CHOL 185 05/25/2019   HDL 87 05/25/2019   LDLCALC 85 05/25/2019   TRIG 71 05/25/2019   CHOLHDL 2.1 05/25/2019    Lab Results  Component  Value Date   TSH 1.390 05/20/2018      ASSESSMENT AND PLAN 47 y.o. 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 here with:  1.  Narcolepsy  Continue Xyrem 4.5 g twice nightly. Blood work ordered CMP Follow-up in 6 months or sooner if needed   Butch Penny, MSN, NP-C 07/09/2022, 3:18 PM [facility] [address] [place], [place] [address] [phone]

## 2022-07-22 ENCOUNTER — Other Ambulatory Visit (INDEPENDENT_AMBULATORY_CARE_PROVIDER_SITE_OTHER): Payer: Self-pay

## 2022-07-22 DIAGNOSIS — G47419 Narcolepsy without cataplexy: Secondary | ICD-10-CM

## 2022-07-22 DIAGNOSIS — Z0289 Encounter for other administrative examinations: Secondary | ICD-10-CM

## 2022-07-23 LAB — COMPREHENSIVE METABOLIC PANEL
ALT: 13 IU/L (ref 0–32)
AST: 17 IU/L (ref 0–40)
Albumin: 4.7 g/dL (ref 3.9–4.9)
Alkaline Phosphatase: 53 IU/L (ref 44–121)
BUN/Creatinine Ratio: 27 — ABNORMAL HIGH (ref 9–23)
BUN: 21 mg/dL (ref 6–24)
Bilirubin Total: 0.7 mg/dL (ref 0.0–1.2)
CO2: 21 mmol/L (ref 20–29)
Calcium: 9.8 mg/dL (ref 8.7–10.2)
Chloride: 102 mmol/L (ref 96–106)
Creatinine, Ser: 0.77 mg/dL (ref 0.57–1.00)
Globulin, Total: 2.1 g/dL (ref 1.5–4.5)
Glucose: 91 mg/dL (ref 70–99)
Potassium: 4.1 mmol/L (ref 3.5–5.2)
Sodium: 138 mmol/L (ref 134–144)
Total Protein: 6.8 g/dL (ref 6.0–8.5)
eGFR: 96 mL/min/{1.73_m2} (ref >59)

## 2022-07-30 ENCOUNTER — Telehealth: Payer: 59 | Admitting: Adult Health

## 2022-08-12 ENCOUNTER — Other Ambulatory Visit: Payer: Self-pay | Admitting: Neurology

## 2022-08-12 DIAGNOSIS — G47411 Narcolepsy with cataplexy: Secondary | ICD-10-CM

## 2022-08-12 MED ORDER — SODIUM OXYBATE 500 MG/ML PO SOLN: ORAL | 5 refills | Status: DC

## 2022-08-22 ENCOUNTER — Encounter: Payer: Self-pay | Admitting: Adult Health

## 2022-12-11 ENCOUNTER — Other Ambulatory Visit (HOSPITAL_COMMUNITY): Payer: Self-pay

## 2022-12-13 ENCOUNTER — Telehealth: Payer: Self-pay | Admitting: Adult Health

## 2022-12-13 NOTE — Telephone Encounter (Signed)
[facility] Morrie Sheldon) Calling to check on PA for Sodium Oxybate (XYREM) 500 MG/ML SOLN. Sent  request for PA on 11/25/22. Would like a call back to discuss why PA was entered in [place] two days ago. Pt's PA is about expire, do we need to send a new one? Reference key: [id number]

## 2022-12-16 ENCOUNTER — Other Ambulatory Visit (HOSPITAL_COMMUNITY): Payer: Self-pay

## 2022-12-16 ENCOUNTER — Telehealth: Payer: Self-pay

## 2022-12-16 NOTE — Telephone Encounter (Signed)
New PA CMM started pre PA team.

## 2022-12-16 NOTE — Telephone Encounter (Signed)
PA request has been Submitted. New Encounter created for follow up. For additional info see Pharmacy Prior Auth telephone encounter from 12/09.

## 2022-12-16 NOTE — Telephone Encounter (Signed)
*  [facility]  Pharmacy Patient Advocate Encounter   Received notification from Pt Calls Messages that prior authorization for Sodium Oxybate 500MG /ML solution  is required/requested.   Insurance verification completed.   The patient is insured through [facility] .   Per test claim: PA required; PA submitted to above mentioned insurance via CoverMyMeds Key/confirmation #/EOC [employer] Status is pending

## 2022-12-17 NOTE — Telephone Encounter (Signed)
Pt states she just received notice that the medication is denied through ins and letter was sent to DR. Requesting call back.

## 2022-12-18 NOTE — Telephone Encounter (Signed)
I called and spoke to [facility] ([facility] pharmacy) PA expires 12-25-2022.  She will ship out 12/12-24 for 30 day supply she will need new PA with her new insurance [employer] ID# [id number] RX PA [phone]. Pt is only on this medication. (Sodium oxybate).    She does not take any other medications.  I left message to have PA team call me.

## 2022-12-18 NOTE — Telephone Encounter (Signed)
Called pt.  She is due for her xyrem this Friday.  Receiving info that she is not approved. Appeal needs to be done.   She has been getting this for years from [facility].

## 2022-12-19 ENCOUNTER — Telehealth: Payer: Self-pay | Admitting: Adult Health

## 2022-12-19 ENCOUNTER — Other Ambulatory Visit (HOSPITAL_COMMUNITY): Payer: Self-pay

## 2022-12-19 NOTE — Telephone Encounter (Signed)
Pharmacy tech returning call to [place], [place]

## 2022-12-19 NOTE — Telephone Encounter (Signed)
Called and spoke with Sanda,RN-just to clarify PT does have a 30DS coming to her-she will need a new PA for the next year with her new Insurance-[employer]. I could not pull PT up on [employer] and the phone number listed in previous note did not go to the PA line it went to a promotional line. Will look up [employer] phone number and try to call them to see when PA can be submitted and when her [facility] will be active.

## 2023-01-07 ENCOUNTER — Encounter: Payer: Self-pay | Admitting: Adult Health

## 2023-01-07 NOTE — Telephone Encounter (Signed)
PA will be needed for Xyrem for the new yr

## 2023-01-10 ENCOUNTER — Telehealth: Payer: Self-pay

## 2023-01-10 ENCOUNTER — Other Ambulatory Visit (HOSPITAL_COMMUNITY): Payer: Self-pay

## 2023-01-10 NOTE — Telephone Encounter (Signed)
   Insurance would like a copy of the Polysomnogram and the MSLT. I did not see it in the chart unless I missed it-Please advise

## 2023-01-14 ENCOUNTER — Other Ambulatory Visit: Payer: Self-pay | Admitting: Neurology

## 2023-01-14 DIAGNOSIS — G47411 Narcolepsy with cataplexy: Secondary | ICD-10-CM

## 2023-01-14 MED ORDER — SODIUM OXYBATE 500 MG/ML PO SOLN: ORAL | 1 refills | Status: DC

## 2023-01-14 NOTE — Telephone Encounter (Signed)
 Pharmacy Patient Advocate Encounter   Received notification from Physician's Office that prior authorization for Sodium Oxybate  500MG /ML solution is required/requested.   Insurance verification completed.   The patient is insured through [facility] .   Per test claim: PA required; PA submitted to above mentioned insurance via CoverMyMeds Key/confirmation #/EOC [facility] Status is pending  I also uploaded the sleep study into chart under the media tab.

## 2023-01-16 ENCOUNTER — Telehealth: Payer: Self-pay | Admitting: Adult Health

## 2023-01-16 NOTE — Telephone Encounter (Signed)
 Morrie Sheldon @[email or web address]  [phone]  option 3 then option 1 states the PA has been denied on Sodium Oxybate (XYREM) 500 MG/ML SOLN because 4 other medications needs to be tried

## 2023-01-16 NOTE — Telephone Encounter (Signed)
 Ok sent message to PA team

## 2023-01-17 ENCOUNTER — Encounter: Payer: Self-pay | Admitting: Adult Health

## 2023-01-17 ENCOUNTER — Other Ambulatory Visit (HOSPITAL_COMMUNITY): Payer: Self-pay

## 2023-01-17 NOTE — Telephone Encounter (Addendum)
 Pharmacy Patient Advocate Encounter  Received notification from [facility] that Prior Authorization for odium Oxybate 500MG /ML solution has been DENIED.  Full denial letter will be uploaded to the media tab. See denial reason below.   PA #/Case ID/Reference #: PA Case ID #: [id number]  Eappeal available on CMM Key: [facility]

## 2023-01-20 ENCOUNTER — Encounter: Payer: Self-pay | Admitting: Neurology

## 2023-01-20 ENCOUNTER — Other Ambulatory Visit: Payer: Self-pay | Admitting: Neurology

## 2023-01-20 ENCOUNTER — Telehealth: Payer: Self-pay | Admitting: Adult Health

## 2023-01-20 NOTE — Telephone Encounter (Signed)
 I called pt and she states she has not had any spills that would account for the discrepancy.  I told her that will have provider to verbal ok . She said she already received call about shipment .  There has been some issue with her PA and the list of medication (clonazepam ) that she states that she is not taking and they have has taking drug.  Medication list is upto date with only solidum oxybate on her list.

## 2023-01-20 NOTE — Telephone Encounter (Signed)
 noted

## 2023-01-20 NOTE — Telephone Encounter (Signed)
 Tori @[facility]  [facility]armacy - [place], [place] - [address]  has called to report a 3 day loss of medication with a need of a unexplained 2 day early fill.  No approval required but they needed to make aware, if there are questions re: this need for the Sodium Oxybate  (XYREM ) 500 MG/ML SOLN call back # is [phone] option 3 option 4

## 2023-01-20 NOTE — Telephone Encounter (Addendum)
 Attempted the e appeal and would not let me send, not sure why. Have completed appeal letter and will submit appeal letter and notes to [facility] I forwarded to both the Clinical pharmacist fax number for review and I also sent to the appeals fax number and marked urgent.

## 2023-01-20 NOTE — Telephone Encounter (Signed)
 Re reading the message from [facility]  no need for call back.  I called back and spoke to [facility] pharmacist.  Pt has not had any other issue recently, last time was 01-17-2021.  If  up to 3 day issue, they will go ahead and refill if not had other instances for the last 6 months.  Sent out today for delivery tomorrow.  Her med list shows no prescribed meds other then the sodium oxybate .  I related appreciation for the information.

## 2023-01-23 ENCOUNTER — Encounter: Payer: Self-pay | Admitting: *Deleted

## 2023-01-24 ENCOUNTER — Telehealth (INDEPENDENT_AMBULATORY_CARE_PROVIDER_SITE_OTHER): Payer: BC Managed Care – PPO | Admitting: Adult Health

## 2023-01-24 DIAGNOSIS — G47419 Narcolepsy without cataplexy: Secondary | ICD-10-CM

## 2023-01-24 NOTE — Progress Notes (Signed)
PATIENT: Joan Foley DOB: September 27, 1975  REASON FOR VISIT: follow up HISTORY FROM: patient  Virtual Visit via Video Note  I connected with Joan Foley on 01/24/23 at 10:45 AM EST by a video enabled telemedicine application located remotely at [facility] and verified that I am speaking with the correct person using two identifiers who was located at their own home.   I discussed the limitations of evaluation and management by telemedicine and the availability of in person appointments. The patient expressed understanding and agreed to proceed.   PATIENT: Joan Foley DOB: [birth year]  REASON FOR VISIT: follow up HISTORY FROM: patient  HISTORY OF PRESENT ILLNESS: Today 01/24/23:  Joan Foley is a 48 y.o. 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 Returns today for follow-up.  She remains on Xyrem 4.5 mg twice a day.  Reports that this continues to work well.  Denies any significant daytime sleepiness.  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  Denies any cataplectic events.  No change in mood or behavior.  No thoughts of suicide or harming others.  Returns today for an evaluation.   07/09/22: Joan Foley is a 48 y.o. 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 Returns today for follow-up.  She remains on Xyrem 4.5 mg twice a day.  This continues to work well for her.  Denies any new issues.  No change in her depression.  No suicidal thoughts.   01/22/22: Joan Foley is a 48 y.o. 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 Returns today for follow-up.  She continues on Xyrem 4.5 mg twice a day.  Denies any new issues.  Feels that Xyrem works well for her.  Denies any issues with falling asleep while driving or at work.  No change in mood or behavior.  Denies having suicidal thoughts.   07/25/21: Joan Foley is a 48 year old female with a history of narcolepsy.  She returns today for follow-up.  Remains on Xyrem 4.5 mg  twice nightly.  She feels that the medication continues to work well for her.  She has noticed that sometimes she gets body aches during the night that will wake her up. Has not dicussed with PCP- in the process of getting established. Denies any thoughts of harming herself or others.  Feels that her depression is well managed at this time.  01/23/21: Joan Foley is a 48 year old female with a history of narcolepsy.  She returns today for follow-up.  She is currently on Xyrem 4.5 mg twice nightly.  She states that her medication was recently denied.  Our office is working on an appeal.  She states that it continues to work well for her.  Denies falling asleep while working or driving.  Denies any changes with her depression or anxiety.  No thoughts of harming herself or others.  06/07/20:   Joan Foley is a 48 year old female with a history of narcolepsy.  She remains on Xyrem 4.5 g twice nightly.  She reports that this continues to work well for her.  She denies falling asleep while working or driving.  Denies any thoughts of harming herself or others.  Feels that her depression is well controlled.  She continues to take Klonopin prescribed by her psychiatrist.    REVIEW OF SYSTEMS: Out of a complete 14 system review of symptoms, the patient complains only of the following symptoms, and all other reviewed systems are negative.  ALLERGIES: No Known Allergies  HOME  MEDICATIONS: Outpatient Medications Prior to Visit  Medication Sig Dispense Refill   Sodium Oxybate (XYREM) 500 MG/ML SOLN 4.5g PO twice nightly. 540 mL 1   No facility-administered medications prior to visit.    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    PAST SURGICAL HISTORY: Past Surgical History:   Procedure Laterality Date   heart disease     valve   LITHOTRIPSY     with renal stones    FAMILY HISTORY: Family History  Problem Relation Age of Onset   Anxiety disorder Mother    Depression Mother    Narcolepsy Father    Depression Father    Anxiety disorder Father     SOCIAL HISTORY: Social History   Socioeconomic History   Marital status: Married    Spouse name: scott   Number of children: 2   Years of education: college   Highest education level: Not on file  Occupational History   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   Caffeine none.   Social Drivers of Corporate investment banker Strain: Not on file  Food Insecurity: Not on file  Transportation Needs: Not on file  Physical Activity: Not on file  Stress: Not on file  Social Connections: Not on file  Intimate Partner Violence: Not on file      PHYSICAL EXAM Generalized: Well developed, in no acute distress   Neurological examination  Mentation: Alert oriented to time, place, history taking. Follows all commands speech and language fluent Cranial nerve II-XII: Facial symmetry noted  DIAGNOSTIC DATA (LABS, IMAGING, TESTING) - I reviewed patient records, labs, notes, testing and imaging myself where available.  Lab Results  Component Value Date   WBC 9.2 09/25/2020   HGB 14.7 09/25/2020   HCT 40.6 09/25/2020   MCV 91 09/25/2020   PLT 209 09/25/2020      Component Value Date/Time   NA 138 07/22/2022 1344   K 4.1 07/22/2022 1344   CL 102  07/22/2022 1344   CO2 21 07/22/2022 1344   GLUCOSE 91 07/22/2022 1344   BUN 21 07/22/2022 1344   CREATININE 0.77 07/22/2022 1344   CALCIUM 9.8 07/22/2022 1344   PROT 6.8 07/22/2022 1344   ALBUMIN 4.7 07/22/2022 1344   AST 17 07/22/2022 1344   ALT 13 07/22/2022 1344   ALKPHOS 53 07/22/2022 1344   BILITOT 0.7 07/22/2022 1344   GFRNONAA 88 05/25/2019 1117   GFRAA 101 05/25/2019 1117   Lab Results  Component Value Date   CHOL 185 05/25/2019   HDL 87 05/25/2019   LDLCALC 85 05/25/2019   TRIG 71 05/25/2019   CHOLHDL 2.1 05/25/2019  Lab Results  Component Value Date   TSH 1.390 05/20/2018      ASSESSMENT AND PLAN 48 y.o. 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 here with:  1.  Narcolepsy  Continue Xyrem 4.5 g twice nightly. Blood work ordered CMP Follow-up in 6 months or sooner if needed   Butch Penny, MSN, NP-C 01/24/2023, 10:45 AM [facility] [address] [place], [place] [address] [phone]

## 2023-01-31 ENCOUNTER — Other Ambulatory Visit (INDEPENDENT_AMBULATORY_CARE_PROVIDER_SITE_OTHER): Payer: Self-pay

## 2023-01-31 DIAGNOSIS — G47419 Narcolepsy without cataplexy: Secondary | ICD-10-CM

## 2023-01-31 DIAGNOSIS — Z0289 Encounter for other administrative examinations: Secondary | ICD-10-CM

## 2023-02-01 LAB — COMPREHENSIVE METABOLIC PANEL
ALT: 8 [IU]/L (ref 0–32)
AST: 11 [IU]/L (ref 0–40)
Albumin: 4.7 g/dL (ref 3.9–4.9)
Alkaline Phosphatase: 58 [IU]/L (ref 44–121)
BUN/Creatinine Ratio: 26 — ABNORMAL HIGH (ref 9–23)
BUN: 20 mg/dL (ref 6–24)
Bilirubin Total: 0.8 mg/dL (ref 0.0–1.2)
CO2: 24 mmol/L (ref 20–29)
Calcium: 9.5 mg/dL (ref 8.7–10.2)
Chloride: 103 mmol/L (ref 96–106)
Creatinine, Ser: 0.78 mg/dL (ref 0.57–1.00)
Globulin, Total: 2 g/dL (ref 1.5–4.5)
Glucose: 94 mg/dL (ref 70–99)
Potassium: 4.1 mmol/L (ref 3.5–5.2)
Sodium: 141 mmol/L (ref 134–144)
Total Protein: 6.7 g/dL (ref 6.0–8.5)
eGFR: 94 mL/min/{1.73_m2} (ref >59)

## 2023-02-03 ENCOUNTER — Encounter: Payer: Self-pay | Admitting: Adult Health

## 2023-03-18 ENCOUNTER — Other Ambulatory Visit: Payer: Self-pay | Admitting: Neurology

## 2023-03-18 DIAGNOSIS — G47411 Narcolepsy with cataplexy: Secondary | ICD-10-CM

## 2023-03-18 MED ORDER — SODIUM OXYBATE 500 MG/ML PO SOLN: ORAL | 1 refills | Status: DC

## 2023-07-24 ENCOUNTER — Telehealth: Payer: BC Managed Care – PPO | Admitting: Adult Health

## 2023-07-24 DIAGNOSIS — G47419 Narcolepsy without cataplexy: Secondary | ICD-10-CM

## 2023-07-24 NOTE — Progress Notes (Signed)
 PATIENT: Joan Foley DOB: 1975-08-04  REASON FOR VISIT: follow up HISTORY FROM: patient  Virtual Visit via Video Note  I connected with Joan Foley on 07/24/23 at 10:30 AM EDT by a video enabled telemedicine application located remotely at [facility] and verified that I am speaking with the correct person using two identifiers who was located at their job in [place].   I discussed the limitations of evaluation and management by telemedicine and the availability of in person appointments. The patient expressed understanding and agreed to proceed.   PATIENT: Joan Foley DOB: [birth year]  REASON FOR VISIT: follow up HISTORY FROM: patient  HISTORY OF PRESENT ILLNESS: Today 07/24/23:  Joan Foley is a 48 y.o. 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 Returns today for follow-up.  She remains on Xyrem  4.5 mg twice a day.  She reports that this is working well for her.  Denies any significant daytime sleepiness.  Denies any changes in her mood or behavior.  No depression or thoughts of suicide.  Patient reports that her blood pressure has been okay.  She does not have a primary care provider.  I did encourage her to set up an appointment.     01/24/23: Joan Foley is a 48 y.o. 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 Returns today for follow-up.  She remains on Xyrem  4.5 mg twice a day.  Reports that this continues to work well.  Denies any significant daytime sleepiness.  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  Denies any cataplectic events.  No change in mood or behavior.  No thoughts of suicide or harming others.  Returns today for an evaluation.   07/09/22: Joan Foley is a 47 y.o. 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 Returns today for follow-up.  She remains on Xyrem  4.5 mg twice a day.  This continues to work well for her.  Denies any new issues.  No change in her depression.  No suicidal  thoughts.   01/22/22: Joan Foley is a 48 y.o. 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 Returns today for follow-up.  She continues on Xyrem  4.5 mg twice a day.  Denies any new issues.  Feels that Xyrem  works well for her.  Denies any issues with falling asleep while driving or at work.  No change in mood or behavior.  Denies having suicidal thoughts.   07/25/21: Joan Foley is a 48 year old female with a history of narcolepsy.  She returns today for follow-up.  Remains on Xyrem  4.5 mg twice nightly.  She feels that the medication continues to work well for her.  She has noticed that sometimes she gets body aches during the night that will wake her up. Has not dicussed with PCP- in the process of getting established. Denies any thoughts of harming herself or others.  Feels that her depression is well managed at this time.  01/23/21: Joan Foley is a 48 year old female with a history of narcolepsy.  She returns today for follow-up.  She is currently on Xyrem  4.5 mg twice nightly.  She states that her medication was recently denied.  Our office is working on an appeal.  She states that it continues to work well for her.  Denies falling asleep while working or driving.  Denies any changes with her depression or anxiety.  No thoughts of harming herself or others.  06/07/20:   Joan Foley is a 48 year old female with a history of  narcolepsy.  She remains on Xyrem  4.5 g twice nightly.  She reports that this continues to work well for her.  She denies falling asleep while working or driving.  Denies any thoughts of harming herself or others.  Feels that her depression is well controlled.  She continues to take Klonopin  prescribed by her psychiatrist.    REVIEW OF SYSTEMS: Out of a complete 14 system review of symptoms, the patient complains only of the following symptoms, and all other reviewed systems are negative.  ALLERGIES: No Known Allergies  HOME MEDICATIONS: Outpatient Medications Prior to  Visit  Medication Sig Dispense Refill   Sodium Oxybate  (XYREM ) 500 MG/ML SOLN 4.5g PO twice nightly. 540 mL 1   No facility-administered medications prior to visit.    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    PAST SURGICAL HISTORY: Past Surgical History:  Procedure Laterality Date   heart disease     valve   LITHOTRIPSY     with renal stones    FAMILY HISTORY: Family History  Problem Relation Age of Onset   Anxiety disorder Mother    Depression Mother    Narcolepsy Father    Depression Father    Anxiety disorder Father     SOCIAL HISTORY: Social History   Socioeconomic History   Marital status: Married    Spouse name: scott   Number of children: 2   Years of education: college   Highest education level: Not on file  Occupational History   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   Caffeine none.   Social Drivers of Corporate investment banker Strain: Not on file  Food Insecurity: Not on file  Transportation Needs: Not on file  Physical Activity: Not on file  Stress: Not on file  Social Connections: Not on file  Intimate Partner Violence: Not on file      PHYSICAL EXAM Generalized: Well developed, in no acute distress   Neurological examination  Mentation: Alert oriented to time,  place, history taking. Follows all commands speech and language fluent Cranial nerve II-XII: Facial symmetry noted  DIAGNOSTIC DATA (LABS, IMAGING, TESTING) - I reviewed patient records, labs, notes, testing and imaging myself where available.  Lab Results  Component Value Date   WBC 9.2 09/25/2020   HGB 14.7 09/25/2020   HCT 40.6 09/25/2020   MCV 91 09/25/2020   PLT 209 09/25/2020      Component Value Date/Time   NA 141 01/31/2023 1056   K 4.1 01/31/2023 1056   CL 103 01/31/2023 1056   CO2 24 01/31/2023 1056   GLUCOSE 94 01/31/2023 1056   BUN 20 01/31/2023 1056  CREATININE 0.78 01/31/2023 1056   CALCIUM 9.5 01/31/2023 1056   PROT 6.7 01/31/2023 1056   ALBUMIN 4.7 01/31/2023 1056   AST 11 01/31/2023 1056   ALT 8 01/31/2023 1056   ALKPHOS 58 01/31/2023 1056   BILITOT 0.8 01/31/2023 1056   GFRNONAA 88 05/25/2019 1117   GFRAA 101 05/25/2019 1117   Lab Results  Component Value Date   CHOL 185 05/25/2019   HDL 87 05/25/2019   LDLCALC 85 05/25/2019   TRIG 71 05/25/2019   CHOLHDL 2.1 05/25/2019    Lab Results  Component Value Date   TSH 1.390 05/20/2018      ASSESSMENT AND PLAN 48 y.o. 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 here with:  1.  Narcolepsy  Continue Xyrem  4.5 g twice nightly. Reviewed blood work from January that was unremarkable. Follow-up in 6 months or sooner if needed.  Next appointment will need to be in office to check vitals and do blood work.   Duwaine Russell, MSN, NP-C 07/24/2023, 10:28 AM [facility] [address], [address] [place], [place] [address] [phone]

## 2023-07-25 NOTE — Progress Notes (Signed)
 I have followed the patient for over a decade , and also want her to seek a new PCP .     I agree with assessment and plan. CD

## 2023-08-20 ENCOUNTER — Telehealth: Payer: Self-pay | Admitting: Adult Health

## 2023-08-20 NOTE — Telephone Encounter (Signed)
 A order was sent and white out is never used on the forms. Its something to do with the way the form comes across to them it looks like it but white out is never used. Called and provided this information to pharmacy.

## 2023-08-20 NOTE — Telephone Encounter (Signed)
 [facility] [place]) Wanted to confirm if whiteout was or was not used on the REM form for Sodium Oxybate  (XYREM ) 500 MG/ML SOLN. Would like a call from the nurse.

## 2023-12-16 ENCOUNTER — Other Ambulatory Visit (HOSPITAL_COMMUNITY): Payer: Self-pay

## 2023-12-16 ENCOUNTER — Telehealth: Payer: Self-pay

## 2023-12-16 NOTE — Telephone Encounter (Signed)
 Pharmacy Patient Advocate Encounter   Received notification from CoverMyMeds that prior authorization for Sodium Oxybate  is required/requested.   Insurance verification completed.   The patient is insured through [facility].   Per test claim: PA required; PA started via CoverMyMeds. KEY [id number] . Waiting for clinical questions to populate.

## 2023-12-18 NOTE — Telephone Encounter (Signed)
 Clinical questions have been answered and PA submitted. PA currently Pending. Please be advised that most companies allow up to 30 days to make a decision. We will advise when a determination has been made, or follow up in 1 week.   Please reach out to our team, Rx Prior Auth Pool, if you haven't heard back in a week.

## 2024-01-20 ENCOUNTER — Other Ambulatory Visit: Payer: Self-pay

## 2024-01-20 ENCOUNTER — Encounter: Payer: Self-pay | Admitting: Neurology

## 2024-01-20 ENCOUNTER — Ambulatory Visit: Admitting: Neurology

## 2024-01-20 VITALS — BP 117/69 | HR 87 | Ht 64.0 in | Wt 116.0 lb

## 2024-01-20 DIAGNOSIS — G47411 Narcolepsy with cataplexy: Secondary | ICD-10-CM

## 2024-01-20 DIAGNOSIS — Z5181 Encounter for therapeutic drug level monitoring: Secondary | ICD-10-CM

## 2024-01-20 DIAGNOSIS — G47419 Narcolepsy without cataplexy: Secondary | ICD-10-CM

## 2024-01-20 MED ORDER — SODIUM OXYBATE 500 MG/ML PO SOLN: ORAL | 1 refills | Status: DC

## 2024-01-20 NOTE — Patient Instructions (Signed)
 ASSESSMENT AND PLAN :   49 y.o. year old female  here with:  Primary narcolepsy without cataplexy, well controlled on 4.5 grams of Xyrem  twice nightly,    Refills will go through [facility] , fed ex shipping.   Today CMET, CBC diff and TSH, Vit D.    RV in 6 months with Duwaine Russell, NP   PS :  Sleep Clinic Patients are generally offered input on sleep hygiene, life style changes and how to improve compliance with medical treatment where applicable. Review and reiteration of good sleep hygiene measures is offered to any sleep clinic patient, be it in the first consultation or with any follow up visits. Any patient with sleepiness should be cautioned not to drive, work at heights, or operate dangerous or heavy equipment when feeling tired or sleepy.

## 2024-01-20 NOTE — Telephone Encounter (Signed)
 Xyrem  prescription will print in pod 3 - cannot be e-send.

## 2024-01-20 NOTE — Progress Notes (Signed)
 "        Provider:  Dedra Gores, MD  Primary Care Physician:  Cyrena Gwenn SQUIBB, MD (Inactive) No address on file     Referring Provider: No referring provider defined for this encounter.          Chief Complaint according to patient   Patient presents with:                HISTORY OF PRESENT ILLNESS:  Joan Foley is a 49 y.o. female patient who is here for revisit 01/20/2024 for  primary Narcolepsy without Cataplexy.  She is doing well on Xyrem  and hasn't needed any dose changes, no longer on Klonopin  -  the only medication she takes is sodium Oxybate  at 4.5 grams twice at night.  Bedtime dose is 10.30 Pm and second dose at 1.30 AM.  No enuresis, no falls, one time nocturia, before second dose.     Chief concern according to patient :   Just a RV with MD for Xyrem  follow up and maintenance.  ESS : 3 from 22 points before dx and Xyrem .  FSS:  at 15 /63.       MM, NP :  07/24/23: VIDEO VISIT   Joan Foley is a 49 y.o. 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 Returns today for follow-up.  She remains on Xyrem  4.5 mg twice a day.  She reports that this is working well for her.  Denies any significant daytime sleepiness.  Denies any changes in her mood or behavior.  No depression or thoughts of suicide.  Patient reports that her blood pressure has been okay.  She does not have a primary care provider.  I did encourage her to set up an appointment.      01/24/23: Joan Foley is a 49 y.o. 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 Returns today for follow-up.  She remains on Xyrem  4.5 mg twice a day.  Reports that this continues to work well.  Denies any significant daytime sleepiness.  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  Denies any cataplectic events.  No change in mood or behavior.  No thoughts of suicide or harming others.  Returns today for an evaluation.     07/09/22: Joan Foley is a 49 y.o. female with a history of  narcolepsy. Returns today for follow-up.  She remains on Xyrem  4.5 mg twice a day.  This continues to work well for her.  Denies any new issues.  No change in her depression.  No suicidal thoughts.     01/22/22: Joan Foley is a 49 y.o. 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 Returns today for follow-up.  She continues on Xyrem  4.5 mg twice a day.  Denies any new issues.  Feels that Xyrem  works well for her.  Denies any issues with falling asleep while driving or at work.  No change in mood or behavior.  Denies having suicidal thoughts.     07/25/21: Joan Foley is a 49 year old female with a history of narcolepsy.  She returns today for follow-up.  Remains on Xyrem  4.5 mg twice nightly.  She feels that the medication continues to work well for her.  She has noticed that sometimes she gets body aches during the night that will wake her up. Has not dicussed with PCP- in the process of getting established. Denies any thoughts of harming herself or others.  Feels that her depression is well managed at this  time.    Joan Foley is a 49 y.o. 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 Blinda,   Mrs. Joan Foley comes narcolepsy was diagnosed after a positive MSL T. on 06-27-09 following normal polysomnography. MSLT showed a MSL of 6. 1 minutes .    At the time she had endorsed  the Epworth sleepiness score at 18 points previously even 20 points at Dr. Patton office. She had been temporarily on the residual but did not feel that it was enough to help her of his fatigue and sleepiness.  She has been treated with Zoloft and clonazepam  for anxiety and depression in the past.  The patient originally presented with a body mass index of 29.5 at the time of her sleep evaluation.  Meanwhile on Xyrem  she has lost weight.  Her last laboratory studies from 02-14-12 are all entirely normal.  She had normal  sodium, potassium, chloride, carbon dioxide levels. Normal glucose and kidney function. Not anemic nor any elevated white blood cells. [name] states that she just changed employment and that she went from part-time to full-time but is actually physically and cognitively well able now to handle the challenges. She seems to feel more comfortable paternal job environment.  Today 's Epworth sleepiness score was and there is endorsed at 10 point and her fatigue severity score at 28 points. 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  Her office has a window now, unlike the old work place.    She recently had an unplanned stay over at a friends house and couldn't take her XYREM ,  She had fragmented, very light sleep and felt poorly the following day.       Review of Systems: Out of a complete 14 system review, the patient complains of only the following symptoms, and all other reviewed systems are negative.:   SLEEPINESS ?  How likely are you to doze in the following situations: 0 = not likely, 1 = slight chance, 2 = moderate chance, 3 = high chance  Sitting and Reading? Watching Television? Sitting inactive in a public place (theater or meeting)? Lying down in the afternoon when circumstances permit? Sitting and talking to someone? Sitting quietly after lunch without alcohol? In a car, while stopped for a few minutes in traffic? As a passenger in a car for an hour without a break?  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   Caffeine none.   Social Drivers of Health  Tobacco Use: Medium Risk (01/20/2024)   Patient History    Smoking Tobacco Use: Former    Smokeless Tobacco Use: Never    Passive Exposure: Not on Actuary Strain: Not on file  Food Insecurity: Not on file  Transportation Needs: Not on file  Physical Activity: Not on file  Stress: Not on file  Social Connections: Not on file  Depression (EYV7-0): Not on file  Alcohol Screen: Not on file  Housing: Not on file  Utilities: Not on file  Health Literacy: Not on file    Family History  Problem Relation Age of Onset   Anxiety disorder Mother    Depression Mother    Narcolepsy Father    Depression Father    Anxiety disorder Father     Past Medical History:  Diagnosis Date   Anxiety    Depression    Excessive sleepiness    Gallstones    Kidney stone    Mastitis    after breast feeding   Narcolepsy    dx MSLT- w/o cataplexy   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  Lab Results  Component Value Date   WBC 9.2 09/25/2020   HGB 14.7 09/25/2020   HCT 40.6 09/25/2020   MCV 91 09/25/2020   PLT 209 09/25/2020      Component Value Date/Time   NA 141 01/31/2023 1056   K 4.1 01/31/2023 1056   CL 103 01/31/2023 1056   CO2 24 01/31/2023 1056   GLUCOSE 94 01/31/2023 1056   BUN 20 01/31/2023 1056   CREATININE 0.78 01/31/2023 1056   CALCIUM 9.5  01/31/2023 1056   PROT 6.7 01/31/2023 1056   ALBUMIN 4.7 01/31/2023 1056   AST 11 01/31/2023 1056   ALT 8 01/31/2023 1056   ALKPHOS 58 01/31/2023 1056   BILITOT 0.8 01/31/2023 1056   GFRNONAA 88 05/25/2019 1117   GFRAA 101 05/25/2019 1117   Lab Results  Component Value Date   CHOL 185 05/25/2019   HDL 87 05/25/2019   LDLCALC 85 05/25/2019   TRIG 71 05/25/2019   CHOLHDL 2.1 05/25/2019   No results found for: HGBA1C No results found for: VITAMINB12 Lab Results  Component Value Date   TSH 1.390 05/20/2018    PHYSICAL EXAM:  Vitals:   01/20/24 0929  BP: 117/69  Pulse: 87   No data found. Body mass index is 19.91 kg/m.   Wt Readings from Last 3 Encounters:  01/20/24 116 lb (52.6 kg)  07/25/21 148 lb (67.1 kg)  06/07/20 149 lb (67.6 kg)     Ht Readings from Last 3 Encounters:  01/20/24 5' 4 (1.626 m)  07/25/21 5' 4 (1.626 m)  06/07/20 5' 3 (1.6 m)      General: The patient is awake, alert and appears not in acute distress and groomed. Head: Normocephalic, atraumatic.  Neck is supple.  Mallampati 1 ,  neck circumference:12. 5  inches .   Nasal airflow  patent.   Overbite Dwan is  seen.  Small lower jaw. Dental status: biological , had braces.  Cardiovascular:  Regular rate and cardiac rhythm by pulse, without distended neck veins. Respiratory: no  shortness of breath  Skin:  Without evidence of ankle edema, or rash. Trunk: BMI is 19.9 (!!)     NEUROLOGIC EXAM: The patient is awake and alert, oriented to place and time.   Memory subjective described as intact.  Attention span & concentration ability appears normal.   Speech is fluent,  without  dysarthria, dysphonia or aphasia.  Mood and affect are appropriate.   Neurological Examination: Mental Status: Intact. Language and speech are normal. No cognitive deficits. Cranial Nerves II-XII: Intact.  PERL. EOMI. VFF. No nystagmus.  No facial droop.  No ptosis.  Hearing is grossly intact  bilaterally.  The tongue is  midline. Motor: Strengths are 5/5 throughout. Gait and Station: Normal.    ASSESSMENT AND PLAN :   49 y.o. year old female  here with:  Primary narcolepsy without cataplexy, well controlled on 4.5 grams of Xyrem  twice nightly,    Refills will go through [facility] , fed ex shipping.   Today CMET, CBC diff and TSH, Vit D.    RV in 6 months with Duwaine Russell, NP   PS :  Sleep Clinic Patients are generally offered input on sleep hygiene, life style changes and how to improve compliance with medical treatment where applicable. Review and reiteration of good sleep hygiene measures is offered to any sleep clinic patient, be it in the first consultation or with any follow up visits.    Any patient with sleepiness should be cautioned not to drive, work at heights, or operate dangerous or heavy equipment when feeling tired or sleepy.      The patient will be seen in follow-up in the sleep clinic at [facility] for discussion of test results, sleep related symptoms and treatment compliance review, further management strategies, etc.   The referring provider will be notified of the test results.   The patient's condition requires frequent monitoring and adjustments in the treatment plan, reflecting the ongoing complexity of care.  This provider is the continuing focal point for all needed services for this condition.  After spending a total time of  30  minutes face to face and time for  history taking, physical and neurologic examination, review of laboratory studies,  personal review of imaging studies, reports and results of other testing and review of referral information / records as far as provided in visit,   Electronically signed by: Dedra Gores, MD 01/20/2024 10:04 AM  [facility] Board certified by The [employer] of Sleep Medicine and Diplomate of the [employer] of Sleep Medicine. Board certified In  Neurology through the ABPN, Fellow of the [employer] of Neurology.      [1]  Current Outpatient Medications on File Prior to Visit  Medication Sig Dispense Refill   Sodium Oxybate  (XYREM ) 500 MG/ML SOLN 4.5g PO twice nightly. 540 mL 1   No current facility-administered medications on file prior to visit.  [2] No Known Allergies  "

## 2024-01-20 NOTE — Addendum Note (Signed)
 Addended by: Yair Dusza-JACKSON, Zeva Leber L on: 01/20/2024 02:29 PM   Modules accepted: Orders

## 2024-01-21 ENCOUNTER — Ambulatory Visit: Payer: Self-pay | Admitting: Neurology

## 2024-01-21 LAB — CBC WITH DIFFERENTIAL/PLATELET
Basophils Absolute: 0.1 x10E3/uL (ref 0.0–0.2)
Basos: 1 %
EOS (ABSOLUTE): 0.1 x10E3/uL (ref 0.0–0.4)
Eos: 1 %
Hematocrit: 44.6 % (ref 34.0–46.6)
Hemoglobin: 14.9 g/dL (ref 11.1–15.9)
Immature Grans (Abs): 0 x10E3/uL (ref 0.0–0.1)
Immature Granulocytes: 0 %
Lymphocytes Absolute: 1.5 x10E3/uL (ref 0.7–3.1)
Lymphs: 27 %
MCH: 31.1 pg (ref 26.6–33.0)
MCHC: 33.4 g/dL (ref 31.5–35.7)
MCV: 93 fL (ref 79–97)
Monocytes Absolute: 0.3 x10E3/uL (ref 0.1–0.9)
Monocytes: 6 %
Neutrophils Absolute: 3.8 x10E3/uL (ref 1.4–7.0)
Neutrophils: 65 %
Platelets: 206 x10E3/uL (ref 150–450)
RBC: 4.79 x10E6/uL (ref 3.77–5.28)
RDW: 12.7 % (ref 11.7–15.4)
WBC: 5.8 x10E3/uL (ref 3.4–10.8)

## 2024-01-21 LAB — COMPREHENSIVE METABOLIC PANEL WITH GFR
ALT: 9 IU/L (ref 0–32)
AST: 14 IU/L (ref 0–40)
Albumin: 4.7 g/dL (ref 3.9–4.9)
Alkaline Phosphatase: 64 IU/L (ref 41–116)
BUN/Creatinine Ratio: 24 — ABNORMAL HIGH (ref 9–23)
BUN: 16 mg/dL (ref 6–24)
Bilirubin Total: 0.5 mg/dL (ref 0.0–1.2)
CO2: 23 mmol/L (ref 20–29)
Calcium: 10 mg/dL (ref 8.7–10.2)
Chloride: 103 mmol/L (ref 96–106)
Creatinine, Ser: 0.67 mg/dL (ref 0.57–1.00)
Globulin, Total: 2.2 g/dL (ref 1.5–4.5)
Glucose: 101 mg/dL — ABNORMAL HIGH (ref 70–99)
Potassium: 4.9 mmol/L (ref 3.5–5.2)
Sodium: 139 mmol/L (ref 134–144)
Total Protein: 6.9 g/dL (ref 6.0–8.5)
eGFR: 108 mL/min/1.73

## 2024-01-21 LAB — TSH+FREE T4
Free T4: 1.41 ng/dL (ref 0.82–1.77)
TSH: 0.657 u[IU]/mL (ref 0.450–4.500)

## 2024-01-21 LAB — VITAMIN D 25 HYDROXY (VIT D DEFICIENCY, FRACTURES): Vit D, 25-Hydroxy: 18.4 ng/mL — ABNORMAL LOW (ref 30.0–100.0)

## 2024-01-21 NOTE — Telephone Encounter (Signed)
 Form completed, signed and faxed to [phone]. Confirmation received.

## 2024-01-26 NOTE — Addendum Note (Signed)
 Addended by: Azrael Huss-JACKSON, Lonnell Chaput L on: 01/26/2024 01:04 PM   Modules accepted: Orders

## 2024-01-27 MED ORDER — SODIUM OXYBATE 500 MG/ML PO SOLN: ORAL | 1 refills | Status: AC

## 2024-01-27 NOTE — Telephone Encounter (Signed)
 Warren from [facility] called to  request to  speak to  someone about Pt medication .  Warren explain that she spoke to Pt pt stated she had a history of depression  however it was resolved in  2023. Warren wanted to know was it okay to still fill Pt mediation  Sodium Oxybate  (XYREM ) 500 MG/ML SOLN  due to  side effect .  Warren would like to ship medication today if possible delivery stop @ 3:30 today  so she will need a call before then     Callback number is  [phone] option 3 then option 4

## 2024-01-27 NOTE — Addendum Note (Signed)
 Addended by: CHALICE SAUNAS on: 01/27/2024 05:49 PM   Modules accepted: Orders

## 2024-01-28 NOTE — Telephone Encounter (Signed)
 I contacted pharmacy, spoke to [place], gave verbal for pt to continue generic Xyrem . She will contact pt to schedule shipment.

## 2024-01-29 ENCOUNTER — Telehealth: Payer: Self-pay | Admitting: *Deleted

## 2024-01-29 NOTE — Telephone Encounter (Signed)
 Sodium oxybate  (xyrem ) rx form ready for Dr Lionell signature. 4.5 grams twice nightly.

## 2024-01-30 NOTE — Telephone Encounter (Signed)
 Signed prescription faxed to [facility]. Received a receipt of confirmation.

## 2024-07-20 ENCOUNTER — Telehealth: Admitting: Adult Health
